# Patient Record
Sex: Male | Born: 1935 | Race: Black or African American | Hispanic: No | Marital: Married | Smoking: Current every day smoker
Health system: Southern US, Community
[De-identification: ages and names within clinical notes are randomized; demographics above are authoritative.]

## PROBLEM LIST (undated history)

## (undated) DIAGNOSIS — R430 Anosmia: Secondary | ICD-10-CM

## (undated) DIAGNOSIS — R1310 Dysphagia, unspecified: Secondary | ICD-10-CM

## (undated) DIAGNOSIS — M25569 Pain in unspecified knee: Secondary | ICD-10-CM

## (undated) DIAGNOSIS — M545 Low back pain, unspecified: Secondary | ICD-10-CM

## (undated) DIAGNOSIS — L729 Follicular cyst of the skin and subcutaneous tissue, unspecified: Secondary | ICD-10-CM

## (undated) DIAGNOSIS — R32 Unspecified urinary incontinence: Secondary | ICD-10-CM

## (undated) DIAGNOSIS — C3490 Malignant neoplasm of unspecified part of unspecified bronchus or lung: Secondary | ICD-10-CM

## (undated) DIAGNOSIS — G459 Transient cerebral ischemic attack, unspecified: Secondary | ICD-10-CM

## (undated) DIAGNOSIS — I10 Essential (primary) hypertension: Secondary | ICD-10-CM

## (undated) DIAGNOSIS — L84 Corns and callosities: Secondary | ICD-10-CM

---

## 2002-01-06 ENCOUNTER — Encounter: Admission: RE | Admit: 2002-01-06 | Discharge: 2002-01-06 | Payer: Self-pay | Admitting: Internal Medicine

## 2002-08-31 ENCOUNTER — Ambulatory Visit (HOSPITAL_COMMUNITY): Admission: RE | Admit: 2002-08-31 | Discharge: 2002-08-31 | Payer: Self-pay | Admitting: Internal Medicine

## 2002-08-31 ENCOUNTER — Encounter: Payer: Self-pay | Admitting: Internal Medicine

## 2002-08-31 ENCOUNTER — Encounter: Admission: RE | Admit: 2002-08-31 | Discharge: 2002-08-31 | Payer: Self-pay | Admitting: Internal Medicine

## 2005-09-04 ENCOUNTER — Encounter: Admission: RE | Admit: 2005-09-04 | Discharge: 2005-09-04 | Payer: Self-pay | Admitting: General Surgery

## 2005-09-07 ENCOUNTER — Ambulatory Visit (HOSPITAL_BASED_OUTPATIENT_CLINIC_OR_DEPARTMENT_OTHER): Admission: RE | Admit: 2005-09-07 | Discharge: 2005-09-07 | Payer: Self-pay | Admitting: General Surgery

## 2013-08-07 ENCOUNTER — Encounter (INDEPENDENT_AMBULATORY_CARE_PROVIDER_SITE_OTHER): Payer: Self-pay | Admitting: Ophthalmology

## 2013-09-17 ENCOUNTER — Encounter (HOSPITAL_COMMUNITY): Payer: Self-pay | Admitting: Emergency Medicine

## 2013-09-17 ENCOUNTER — Emergency Department (HOSPITAL_COMMUNITY): Payer: Medicare Other

## 2013-09-17 ENCOUNTER — Inpatient Hospital Stay (HOSPITAL_COMMUNITY)
Admission: EM | Admit: 2013-09-17 | Discharge: 2013-09-18 | DRG: 069 | Disposition: A | Discharge status: Home / Self Care | Payer: Medicare Other | Attending: Internal Medicine | Admitting: Internal Medicine

## 2013-09-17 DIAGNOSIS — G459 Transient cerebral ischemic attack, unspecified: Principal | ICD-10-CM | POA: Diagnosis present

## 2013-09-17 DIAGNOSIS — I10 Essential (primary) hypertension: Secondary | ICD-10-CM | POA: Diagnosis present

## 2013-09-17 DIAGNOSIS — Z79899 Other long term (current) drug therapy: Secondary | ICD-10-CM

## 2013-09-17 DIAGNOSIS — R4701 Aphasia: Secondary | ICD-10-CM | POA: Diagnosis present

## 2013-09-17 DIAGNOSIS — R42 Dizziness and giddiness: Secondary | ICD-10-CM | POA: Diagnosis present

## 2013-09-17 DIAGNOSIS — G8190 Hemiplegia, unspecified affecting unspecified side: Secondary | ICD-10-CM | POA: Diagnosis present

## 2013-09-17 DIAGNOSIS — R55 Syncope and collapse: Secondary | ICD-10-CM | POA: Diagnosis present

## 2013-09-17 HISTORY — DX: Essential (primary) hypertension: I10

## 2013-09-17 LAB — I-STAT TROPONIN, ED: Troponin i, poc: 0 ng/mL (ref 0.00–0.08)

## 2013-09-17 LAB — I-STAT CHEM 8, ED
BUN: 11 mg/dL (ref 6–23)
CREATININE: 1.1 mg/dL (ref 0.50–1.35)
Calcium, Ion: 1.09 mmol/L — ABNORMAL LOW (ref 1.13–1.30)
Chloride: 105 mEq/L (ref 96–112)
Glucose, Bld: 112 mg/dL — ABNORMAL HIGH (ref 70–99)
HCT: 44 % (ref 39.0–52.0)
HEMOGLOBIN: 15 g/dL (ref 13.0–17.0)
POTASSIUM: 4 meq/L (ref 3.7–5.3)
SODIUM: 140 meq/L (ref 137–147)
TCO2: 24 mmol/L (ref 0–100)

## 2013-09-17 LAB — COMPREHENSIVE METABOLIC PANEL
ALBUMIN: 4 g/dL (ref 3.5–5.2)
ALK PHOS: 60 U/L (ref 39–117)
ALT: 11 U/L (ref 0–53)
AST: 22 U/L (ref 0–37)
BILIRUBIN TOTAL: 0.7 mg/dL (ref 0.3–1.2)
BUN: 12 mg/dL (ref 6–23)
CO2: 24 mEq/L (ref 19–32)
Calcium: 9.3 mg/dL (ref 8.4–10.5)
Chloride: 100 mEq/L (ref 96–112)
Creatinine, Ser: 0.9 mg/dL (ref 0.50–1.35)
GFR calc Af Amer: >90 mL/min (ref >90)
GFR calc non Af Amer: 81 mL/min — ABNORMAL LOW (ref >90)
Glucose, Bld: 118 mg/dL — ABNORMAL HIGH (ref 70–99)
POTASSIUM: 4.2 meq/L (ref 3.7–5.3)
Sodium: 139 mEq/L (ref 137–147)
TOTAL PROTEIN: 7.8 g/dL (ref 6.0–8.3)

## 2013-09-17 LAB — DIFFERENTIAL
BASOS ABS: 0 10*3/uL (ref 0.0–0.1)
BASOS PCT: 1 % (ref 0–1)
Eosinophils Absolute: 0.1 10*3/uL (ref 0.0–0.7)
Eosinophils Relative: 1 % (ref 0–5)
Lymphocytes Relative: 45 % (ref 12–46)
Lymphs Abs: 1.9 10*3/uL (ref 0.7–4.0)
Monocytes Absolute: 0.4 10*3/uL (ref 0.1–1.0)
Monocytes Relative: 10 % (ref 3–12)
NEUTROS PCT: 43 % (ref 43–77)
Neutro Abs: 1.8 10*3/uL (ref 1.7–7.7)

## 2013-09-17 LAB — RAPID URINE DRUG SCREEN, HOSP PERFORMED
Amphetamines: NOT DETECTED
BARBITURATES: NOT DETECTED
BENZODIAZEPINES: NOT DETECTED
COCAINE: NOT DETECTED
OPIATES: NOT DETECTED
Tetrahydrocannabinol: NOT DETECTED

## 2013-09-17 LAB — CBC
HCT: 39.9 % (ref 39.0–52.0)
HEMOGLOBIN: 14.1 g/dL (ref 13.0–17.0)
MCH: 34.4 pg — ABNORMAL HIGH (ref 26.0–34.0)
MCHC: 35.3 g/dL (ref 30.0–36.0)
MCV: 97.3 fL (ref 78.0–100.0)
Platelets: 236 10*3/uL (ref 150–400)
RBC: 4.1 MIL/uL — ABNORMAL LOW (ref 4.22–5.81)
RDW: 17.5 % — AB (ref 11.5–15.5)
WBC: 4.2 10*3/uL (ref 4.0–10.5)

## 2013-09-17 LAB — URINALYSIS, ROUTINE W REFLEX MICROSCOPIC
BILIRUBIN URINE: NEGATIVE
Glucose, UA: NEGATIVE mg/dL
HGB URINE DIPSTICK: NEGATIVE
KETONES UR: NEGATIVE mg/dL
Leukocytes, UA: NEGATIVE
NITRITE: NEGATIVE
PH: 7.5 (ref 5.0–8.0)
Protein, ur: NEGATIVE mg/dL
Specific Gravity, Urine: 1.017 (ref 1.005–1.030)
Urobilinogen, UA: 1 mg/dL (ref 0.0–1.0)

## 2013-09-17 LAB — PROTIME-INR
INR: 0.92 (ref 0.00–1.49)
PROTHROMBIN TIME: 12.2 s (ref 11.6–15.2)

## 2013-09-17 LAB — ETHANOL: Alcohol, Ethyl (B): <11 mg/dL (ref 0–11)

## 2013-09-17 LAB — CBG MONITORING, ED: GLUCOSE-CAPILLARY: 90 mg/dL (ref 70–99)

## 2013-09-17 LAB — APTT: APTT: 30 s (ref 24–37)

## 2013-09-17 MED ORDER — METOPROLOL SUCCINATE ER 50 MG PO TB24
50.0000 mg | ORAL_TABLET | Freq: Every day | ORAL | Status: DC
  Administered 2013-09-18: 50 mg via ORAL
  Filled 2013-09-17: qty 1

## 2013-09-17 MED ORDER — ASPIRIN EC 81 MG PO TBEC
81.0000 mg | DELAYED_RELEASE_TABLET | Freq: Every day | ORAL | Status: DC
  Filled 2013-09-17: qty 1

## 2013-09-17 MED ORDER — LABETALOL HCL 5 MG/ML IV SOLN
20.0000 mg | Freq: Once | INTRAVENOUS | Status: AC
  Administered 2013-09-17: 20 mg via INTRAVENOUS
  Filled 2013-09-17: qty 4

## 2013-09-17 MED ORDER — ASPIRIN 325 MG PO TABS
325.0000 mg | ORAL_TABLET | Freq: Every day | ORAL | Status: DC
Start: 2013-09-17 — End: 2013-09-18
  Administered 2013-09-18 (×2): 325 mg via ORAL
  Filled 2013-09-17 (×3): qty 1

## 2013-09-17 MED ORDER — ENOXAPARIN SODIUM 40 MG/0.4ML ~~LOC~~ SOLN
40.0000 mg | SUBCUTANEOUS | Status: DC
  Administered 2013-09-17: 40 mg via SUBCUTANEOUS
  Filled 2013-09-17 (×2): qty 0.4

## 2013-09-17 NOTE — ED Provider Notes (Signed)
CSN: [id number]     Arrival date & time 09/17/13  1352 History   First MD Initiated Contact with Patient 09/17/13 1355     Chief Complaint  Patient presents with  . Code Stroke     HPI  Patient presents as a code stroke. Notably, on arrival to the emergency department patient's symptoms are resolved. Patient states that approximately one hour prior to ED arrival the patient had one episode of syncope, subsequently felt weakness throughout the left side, aphasia, no pain. There was some waxing/waning character to the symptoms, though they were present until just before my evaluation. Patient denies history of syncope or TIA or stroke.   No past medical history on file. No past surgical history on file. No family history on file. History  Substance Use Topics  . Smoking status: Not on file  . Smokeless tobacco: Not on file  . Alcohol Use: Not on file    Review of Systems  Constitutional:       Per HPI, otherwise negative  HENT:       Per HPI, otherwise negative  Respiratory:       Per HPI, otherwise negative  Cardiovascular:       Per HPI, otherwise negative  Gastrointestinal: Negative for vomiting.  Endocrine:       Negative aside from HPI  Genitourinary:       Neg aside from HPI   Musculoskeletal:       Per HPI, otherwise negative  Skin: Negative.   Neurological: Positive for syncope, facial asymmetry, weakness and light-headedness. Negative for seizures and headaches.      Allergies  Review of patient's allergies indicates not on file.  Home Medications  No current outpatient prescriptions on file. There were no vitals taken for this visit. Physical Exam  Nursing note and vitals reviewed. Constitutional: He is oriented to person, place, and time. He appears well-developed. No distress.  HENT:  Head: Normocephalic and atraumatic.  Eyes: Conjunctivae and EOM are normal.  Cardiovascular: Normal rate and regular rhythm.   Pulmonary/Chest: Effort normal. No  stridor. No respiratory distress.  Abdominal: He exhibits no distension.  Musculoskeletal: He exhibits no edema.  Neurological: He is alert and oriented to person, place, and time. No cranial nerve deficit. He exhibits normal muscle tone. Coordination normal.  No facial symmetry, grip strength is 5/5 in both hands, patient was ultimately spontaneously Patient is oriented x3, denies any weakness anywhere.  Skin: Skin is warm and dry.  Psychiatric: He has a normal mood and affect.    ED Course  Procedures (including critical care time) Labs Review Labs Reviewed  I-STAT CHEM 8, ED - Abnormal; Notable for the following:    Glucose, Bld 112 (*)    Calcium, Ion 1.09 (*)    All other components within normal limits  PROTIME-INR  APTT  CBC  DIFFERENTIAL  COMPREHENSIVE METABOLIC PANEL  ETHANOL  URINE RAPID DRUG SCREEN (HOSP PERFORMED)  URINALYSIS, ROUTINE W REFLEX MICROSCOPIC  CBG MONITORING, ED  I-STAT TROPOININ, ED  I-STAT TROPOININ, ED   Imaging Review Ct Head (brain) Wo Contrast  09/17/2013   CLINICAL DATA:  79 year old male with left side weakness and facial droop. Code stroke. Unable to speak. Initial encounter.  EXAM: CT HEAD WITHOUT CONTRAST  TECHNIQUE: Contiguous axial images were obtained from the base of the skull through the vertex without intravenous contrast.  COMPARISON:  None.  FINDINGS: No acute orbit or scalp soft tissue findings. Mild paranasal sinus mucosal thickening, with  bubbly opacity in the left sphenoid. Tympanic cavities and mastoids are clear. No acute osseous abnormality identified.  Calcified atherosclerosis at the skull base. Patchy bilateral cerebral white matter hypodensity. Some involvement of the deep white matter capsules. Suggestion of chronic right lentiform nuclei lacunar infarct. No suspicious intracranial vascular hyperdensity. No evidence of cortically based acute infarction identified.  No ventriculomegaly. No midline shift, mass effect, or evidence  of intracranial mass lesion. No acute intracranial hemorrhage identified.  IMPRESSION: Chronic small vessel ischemia. No acute cortically based infarct is evident. No acute intracranial abnormality identified.  Study discussed by telephone with Dr. Armida Sans On 09/17/2013 at 14:10 .   Electronically Signed   By: Lars Pinks M.D.   On: 09/17/2013 14:11     EKG Interpretation   Date/Time:  Sunday September 17 2013 14:09:29 EDT Ventricular Rate:  73 PR Interval:  148 QRS Duration: 80 QT Interval:  388 QTC Calculation: 427 R Axis:   67 Text Interpretation:  Sinus rhythm LAE, consider biatrial enlargement  Anterior infarct, old Sinus rhythm Artifact ST-t abnormality No  significant change since last tracing Abnormal ekg Confirmed by Carmin Muskrat  MD [phone]) on 09/17/2013 2:17:09 PM      MDM  Patient presents after syncopal episode with subsequent left-sided weakness, facial asymmetry, aphasia. Symptoms resolved entirely, though with no prior workup, and with risk factors, patient was admitted for TIA evaluation after evaluation with our neurology colleagues commanding, labs, stabilization.  Carmin Muskrat, MD 09/17/13 1505

## 2013-09-17 NOTE — Consult Note (Signed)
Referring Physician: Dr Vanita Panda    Chief Complaint: TIA  HPI: Andre Silva is a 79 y.o. male who has not had regular medical care over the years. About a month ago he saw Dr. Nolene Ebbs  for evaluation of hypertension and was placed on metoprolol. The dosage is not available to this time. Today, 09/17/2013, the patient was at home working on his computer when he began to feel lightheaded. He went to lie down on his couch and after a while felt better. He decided to go outside to play with his dogs. His neighbor was outside doing some yard work. The neighbor noticed that the patient appeared to be staggering. Shortly thereafter the patient collapsed and his neighbor came over to assist him. The patient was unable to talk and was unable to move his left side. His neighbor helped him into his house and placed him back on the couch. The patient called 911; however, by the time they arrived the patient's deficits had completely resolved. They were about to leave his house when suddenly the patient's left face began to droop and he once again he became plegic on the left with inability to speak. He was brought to [facility] as a code stroke although by the time they arrived in the ED he was again back to normal. A stat head CT was performed which showed no acute findings. His NIH scale was 0. The code stroke was canceled. He is to be admitted for a TIA workup. The patient states that he has had several similar episodes in the past prior to starting metoprolol. He has never had complete loss of consciousness with these episodes although he has experienced left-sided weakness. Systolic blood pressure was greater than 200 on arrival.  Date last known well: 09/17/2013 Time last known well: Unable to determine tPA Given: No: Deficits completely resolved NIHS - 0  Past Medical History  Diagnosis Date  . Hypertension   Probable COPD  Surgical history - prostate surgery  Family history - his  mother died at age 6. His father died at age 49. He does not know details regarding their health.  Social History:  The patient lives alone most of the time. He has a girlfriend who visits frequently. He quit smoking approximately one year ago but states he smoked most of his life.  Allergies - KNDA  Medications: The patient started metoprolol proximally 1 month ago for hypertension. The dosage is not available to this time. He was not on antiplatelet therapy.   No current facility-administered medications for this encounter.   No current outpatient prescriptions on file.    ROS: History obtained from the patient  General ROS: negative for - chills, fatigue, fever, night sweats. The patient appears thin however he states that he has had no change in his weight over the years. Psychological ROS: negative for - behavioral disorder, hallucinations, memory difficulties, mood swings or suicidal ideation Ophthalmic ROS: negative for - blurry vision, double vision, eye pain or loss of vision ENT ROS: negative for - epistaxis, nasal discharge, oral lesions, sore throat, tinnitus or vertigo Allergy and Immunology ROS: negative for - hives or itchy/watery eyes Hematological and Lymphatic ROS: negative for - bleeding problems, bruising or swollen lymph nodes Endocrine ROS: negative for - galactorrhea, hair pattern changes, polydipsia/polyuria or temperature intolerance Respiratory ROS: negative for - cough, hemoptysis, shortness of breath or wheezing Cardiovascular ROS: negative for - chest pain, dyspnea on exertion, edema or irregular heartbeat Gastrointestinal ROS:  negative for - abdominal pain, diarrhea, hematemesis, nausea/vomiting or stool incontinence. The patient has had difficulty swallowing for quite some time. He feels as if food gets stuck in his throat and at times has difficulty handling his own secretions. He has never had this evaluated. Genito-Urinary ROS: negative for - dysuria,  hematuria, incontinence or urinary frequency/urgency Musculoskeletal ROS: negative for - joint swelling or muscular weakness Neurological ROS: as noted in HPI Dermatological ROS: negative for rash and skin lesion changes   Physical Examination: There were no vitals taken for this visit.  General - Thin pleasant 79 year old male with gravelly sounding voice but in no acute distress. Heart - Regular rate and rhythm - distant heart sounds Lungs - decreased breath sounds throughout. No wheezing, rales, or rhonchi Extremities - no edema Skin - Warm and dry   Neurologic Examination: Mental Status: Alert, oriented, thought content appropriate.  Speech fluent without evidence of aphasia.  Able to follow 3 step commands without difficulty. Cranial Nerves: II: Discs flat bilaterally; Visual fields grossly normal, pupils equal, round, reactive to light and accommodation III,IV, VI: ptosis not present, extra-ocular motions intact bilaterally V,VII: smile at times appears asymmetric however with closer examination there is not appear to be weakness or drooping, facial light touch sensation normal bilaterally VIII: hearing normal bilaterally IX,X: gag reflex present XI: bilateral shoulder shrug XII: midline tongue extension Motor: Right : Upper extremity   5/5    Left:     Upper extremity   5/5  Lower extremity   5/5     Lower extremity   5/5 Tone and bulk:normal tone throughout; no atrophy noted Sensory: Pinprick and light touch intact throughout, bilaterally Deep Tendon Reflexes: 2+ and symmetric throughout Plantars: Right: downgoing   Left: downgoing Cerebellar: normal finger-to-nose Gait: Deferred   Laboratory Studies:  Basic Metabolic Panel:  Recent Labs Lab 09/17/13 1354 09/17/13 1404  NA 139 140  K 4.2 4.0  CL 100 105  CO2 24  --   GLUCOSE 118* 112*  BUN 12 11  CREATININE 0.90 1.10  CALCIUM 9.3  --     Liver Function Tests:  Recent Labs Lab 09/17/13 1354  AST 22   ALT 11  ALKPHOS 60  BILITOT 0.7  PROT 7.8  ALBUMIN 4.0   No results found for this basename: LIPASE, AMYLASE,  in the last 168 hours No results found for this basename: AMMONIA,  in the last 168 hours  CBC:  Recent Labs Lab 09/17/13 1354 09/17/13 1404  WBC 4.2  --   NEUTROABS 1.8  --   HGB 14.1 15.0  HCT 39.9 44.0  MCV 97.3  --   PLT 236  --     Cardiac Enzymes: No results found for this basename: CKTOTAL, CKMB, CKMBINDEX, TROPONINI,  in the last 168 hours  BNP: No components found with this basename: POCBNP,   CBG:  Recent Labs Lab 09/17/13 1411  GLUCAP 90    Microbiology: No results found for this or any previous visit.  Coagulation Studies:  Recent Labs  09/17/13 1354  LABPROT 12.2  INR 0.92    Urinalysis: No results found for this basename: COLORURINE, APPERANCEUR, LABSPEC, PHURINE, GLUCOSEU, HGBUR, BILIRUBINUR, KETONESUR, PROTEINUR, UROBILINOGEN, NITRITE, LEUKOCYTESUR,  in the last 168 hours  Lipid Panel: No results found for this basename: chol, trig, hdl, cholhdl, vldl, ldlcalc    HgbA1C:  No results found for this basename: HGBA1C    Urine Drug Screen:   No results found for this basename:  labopia, cocainscrnur, labbenz, amphetmu, thcu, labbarb    Alcohol Level:  Recent Labs Lab 09/17/13 1354  ETH <11    Other results: EKG: Sinus rhythm rate 73 beats per minute.  See formal reading for complete details.  Imaging:  Ct Head (brain) Wo Contrast 09/17/2013    Chronic small vessel ischemia. No acute cortically based infarct is evident. No acute intracranial abnormality identified.      Assessment: 79 y.o. male with lightheadedness, presyncope, collapse, transient aphasia, and transient recurrent left hemiplegia. Suspect TIAs. Similar episodes in the past. Admit for full workup.  Stroke Risk Factors - hypertension and tobacco - quite 1 year ago  Plan: 1. HgbA1c, fasting lipid panel 2. MRI, MRA  of the brain without contrast 3.  Speech consult 4. Echocardiogram 5. Carotid dopplers 6. Prophylactic therapy-Antiplatelet med: Aspirin - dose 81 mg daily 7. Risk factor modification 8. Telemetry monitoring 9. Frequent neuro checks   Lowry Ram [facility] Pager [phone] 09/17/2013, 3:27 PM   Patient seen and examined together with physician assistant and I concur with the assessment and plan.  Dorian Pod, MD

## 2013-09-17 NOTE — Progress Notes (Signed)
Patient admitted to the floor from the ER, PATIENT IS ALERT AND ORIENTED X4

## 2013-09-17 NOTE — H&P (Signed)
[facility] History and Physical  Andre Silva FXT:[id number] DOB: 1935-04-14 DOA: 09/17/2013  Referring physician: ER physician PCP: No primary provider on file.   Chief Complaint: syncope  HPI:  79 year old male with no significant past medical history other than recently diagnosed hypertension who presented to [facility] ED 09/17/2013 after syncopal event at home. Patient reported prodromal symptoms of lightheadedness prior to syncope. Patient did not other symptoms of chest pain, shortness of breath or palpitations. He was assisted by his neighbor who saw him collapsing. There was no reported seizures. No fever. No blood in stool or urine. No nausea or vomiting. By the time EMS arrived pt symptoms all resolved.  In ED, vitals are stable. All symptoms are now resolved. CT head did not show acute intracranial findings. Neurology was consulted by ED physician.  Assessment and Plan:  Active Problems:   Syncope, TIA (transient ischemic attack) - unclear etiology - no acute intracranial findings on CT head - appreciate neurology assistance; will check MRI brain, carotid doppler, lipid panel, A1c - PT/OT evaluation   Hypertension - on metoprolol 50 mg daily   Radiological Exams on Admission: Ct Head (brain) Wo Contrast 09/17/2013   IMPRESSION: Chronic small vessel ischemia. No acute cortically based infarct is evident. No acute intracranial abnormality identified.  Code Status: Full Family Communication: Pt at bedside Disposition Plan: Admit for further evaluation  Leisa Lenz, MD  [facility] Pager [phone]  Review of Systems:  Constitutional: Negative for fever, chills and malaise/fatigue. Negative for diaphoresis.  HENT: Negative for hearing loss, ear pain, nosebleeds, congestion, sore throat, neck pain, tinnitus and ear discharge.  Eyes: Negative for blurred vision, double vision, photophobia, pain, discharge and redness.  Respiratory: Negative for cough, hemoptysis,  sputum production, shortness of breath, wheezing and stridor.   Cardiovascular: Negative for chest pain, palpitations, orthopnea, claudication and leg swelling.  Gastrointestinal: Negative for nausea, vomiting and abdominal pain. Negative for heartburn, constipation, blood in stool and melena.  Genitourinary: Negative for dysuria, urgency, frequency, hematuria and flank pain.  Musculoskeletal: Negative for myalgias, back pain, joint pain and falls.  Skin: Negative for itching and rash.  Neurological: per HPI  Endo/Heme/Allergies: Negative for environmental allergies and polydipsia. Does not bruise/bleed easily.  Psychiatric/Behavioral: Negative for suicidal ideas. The patient is not nervous/anxious.      Past Medical History  Diagnosis Date  . Hypertension    History reviewed. No pertinent past surgical history. Social History:  reports that he has quit smoking. He does not have any smokeless tobacco history on file. His alcohol and drug histories are not on file.  No Known Allergies  Family History: no history of cancers, no cardiovascular diseases on mother or father side  Prior to Admission medications   Medication Sig Start Date End Date Taking? Authorizing Provider  chlorhexidine (PERIDEX) 0.12 % solution Use as directed 10 mLs in the mouth or throat 2 (two) times daily.  08/23/13  Yes Historical Provider, MD  metoprolol succinate (TOPROL-XL) 50 MG 24 hr tablet Take 50 mg by mouth daily. 08/23/13  Yes Historical Provider, MD  Omega-3 Fatty Acids (FISH OIL PO) Take 1 capsule by mouth every other day.   Yes Historical Provider, MD  OVER THE COUNTER MEDICATION Place 1 drop into both eyes daily as needed (dry eyes).   Yes Historical Provider, MD   Physical Exam: There were no vitals filed for this visit.  Physical Exam  Constitutional: Appears well-developed and well-nourished. No distress.  HENT: Normocephalic. External  right and left ear normal. Oropharynx is clear and moist.  Eyes:  Conjunctivae and EOM are normal. PERRLA, no scleral icterus.  Neck: Normal ROM. Neck supple. No JVD. No tracheal deviation. No thyromegaly.  CVS: RRR, S1/S2 +, no murmurs, no gallops, no carotid bruit.  Pulmonary: Effort and breath sounds normal, no stridor, rhonchi, wheezes, rales.  Abdominal: Soft. BS +,  no distension, tenderness, rebound or guarding.  Musculoskeletal: Normal range of motion. No edema and no tenderness.  Lymphadenopathy: No lymphadenopathy noted, cervical, inguinal. Neuro: Alert. Normal reflexes, muscle tone coordination. No cranial nerve deficit. Skin: Skin is warm and dry. No rash noted. Not diaphoretic. No erythema. No pallor.  Psychiatric: Normal mood and affect. Behavior, judgment, thought content normal.   Labs on Admission:  Basic Metabolic Panel:  Recent Labs Lab 09/17/13 1354 09/17/13 1404  NA 139 140  K 4.2 4.0  CL 100 105  CO2 24  --   GLUCOSE 118* 112*  BUN 12 11  CREATININE 0.90 1.10  CALCIUM 9.3  --    Liver Function Tests:  Recent Labs Lab 09/17/13 1354  AST 22  ALT 11  ALKPHOS 60  BILITOT 0.7  PROT 7.8  ALBUMIN 4.0   No results found for this basename: LIPASE, AMYLASE,  in the last 168 hours No results found for this basename: AMMONIA,  in the last 168 hours CBC:  Recent Labs Lab 09/17/13 1354 09/17/13 1404  WBC 4.2  --   NEUTROABS 1.8  --   HGB 14.1 15.0  HCT 39.9 44.0  MCV 97.3  --   PLT 236  --    Cardiac Enzymes: No results found for this basename: CKTOTAL, CKMB, CKMBINDEX, TROPONINI,  in the last 168 hours BNP: No components found with this basename: POCBNP,  CBG:  Recent Labs Lab 09/17/13 1411  GLUCAP 90    If 7PM-7AM, please contact night-coverage [email or web address] Password [email or web address] 09/17/2013, 3:23 PM

## 2013-09-17 NOTE — ED Notes (Signed)
PT reported to EMS that he had a weak episode out his yard today.A neighbor helped PT into his home. EMS staff reported PT was neg for stroke SX's on there arrival. During assessment PT developed a LT sided  mouth droop and was unable to move LT side. On arrival to [facility] Pt had no Symptoms . Smile equal . Pt had full movement of LT side.

## 2013-09-17 NOTE — ED Notes (Signed)
NEXT neuro check 1900

## 2013-09-18 ENCOUNTER — Inpatient Hospital Stay (HOSPITAL_COMMUNITY): Payer: Medicare Other

## 2013-09-18 DIAGNOSIS — I10 Essential (primary) hypertension: Secondary | ICD-10-CM | POA: Diagnosis present

## 2013-09-18 DIAGNOSIS — R55 Syncope and collapse: Secondary | ICD-10-CM | POA: Diagnosis present

## 2013-09-18 DIAGNOSIS — I519 Heart disease, unspecified: Secondary | ICD-10-CM

## 2013-09-18 LAB — CBC
HCT: 35 % — ABNORMAL LOW (ref 39.0–52.0)
Hemoglobin: 12.2 g/dL — ABNORMAL LOW (ref 13.0–17.0)
MCH: 34.3 pg — AB (ref 26.0–34.0)
MCHC: 34.9 g/dL (ref 30.0–36.0)
MCV: 98.3 fL (ref 78.0–100.0)
Platelets: 207 10*3/uL (ref 150–400)
RBC: 3.56 MIL/uL — ABNORMAL LOW (ref 4.22–5.81)
RDW: 17.5 % — AB (ref 11.5–15.5)
WBC: 2.9 10*3/uL — AB (ref 4.0–10.5)

## 2013-09-18 LAB — RAPID URINE DRUG SCREEN, HOSP PERFORMED
AMPHETAMINES: NOT DETECTED
Barbiturates: NOT DETECTED
Benzodiazepines: NOT DETECTED
Cocaine: NOT DETECTED
OPIATES: NOT DETECTED
Tetrahydrocannabinol: NOT DETECTED

## 2013-09-18 LAB — GLUCOSE, CAPILLARY
GLUCOSE-CAPILLARY: 131 mg/dL — AB (ref 70–99)
Glucose-Capillary: 82 mg/dL (ref 70–99)

## 2013-09-18 LAB — LIPID PANEL
CHOLESTEROL: 149 mg/dL (ref 0–200)
HDL: 57 mg/dL (ref >39)
LDL Cholesterol: 82 mg/dL (ref 0–99)
Total CHOL/HDL Ratio: 2.6 RATIO
Triglycerides: 51 mg/dL (ref <150)
VLDL: 10 mg/dL (ref 0–40)

## 2013-09-18 LAB — BASIC METABOLIC PANEL
BUN: 16 mg/dL (ref 6–23)
CALCIUM: 8.8 mg/dL (ref 8.4–10.5)
CO2: 24 mEq/L (ref 19–32)
CREATININE: 0.91 mg/dL (ref 0.50–1.35)
Chloride: 103 mEq/L (ref 96–112)
GFR calc non Af Amer: 80 mL/min — ABNORMAL LOW (ref >90)
Glucose, Bld: 91 mg/dL (ref 70–99)
Potassium: 4.3 mEq/L (ref 3.7–5.3)
Sodium: 141 mEq/L (ref 137–147)

## 2013-09-18 LAB — HEMOGLOBIN A1C
Hgb A1c MFr Bld: 5.6 % (ref <5.7)
MEAN PLASMA GLUCOSE: 114 mg/dL (ref <117)

## 2013-09-18 MED ORDER — AMLODIPINE BESYLATE 5 MG PO TABS: 5.0000 mg | ORAL_TABLET | Freq: Every day | ORAL | Status: DC

## 2013-09-18 MED ORDER — HYDRALAZINE HCL 20 MG/ML IJ SOLN
10.0000 mg | Freq: Once | INTRAMUSCULAR | Status: AC
  Administered 2013-09-18: 10 mg via INTRAVENOUS
  Filled 2013-09-18: qty 1

## 2013-09-18 MED ORDER — ASPIRIN 325 MG PO TABS: 325.0000 mg | ORAL_TABLET | Freq: Every day | ORAL | Status: DC

## 2013-09-18 NOTE — Progress Notes (Signed)
NEURO HOSPITALIST PROGRESS NOTE   SUBJECTIVE:                                                                                                                        No further complaints or issues. Symptoms have resolved.   OBJECTIVE:                                                                                                                           Vital signs in last 24 hours: Temp:  [98 F (36.7 C)-98.3 F (36.8 C)] 98 F (36.7 C) (03/30 0518) Pulse Rate:  [64-119] 72 (03/30 0518) Resp:  [12-21] 16 (03/30 0518) BP: (122-208)/(74-122) 134/81 mmHg (03/30 0518) SpO2:  [91 %-100 %] 97 % (03/30 0518)  Intake/Output from previous day: 03/29 0701 - 03/30 0700 In: -  Out: 200 [Urine:200] Intake/Output this shift:   Nutritional status: Cardiac  Past Medical History  Diagnosis Date  . Hypertension      Neurologic Exam:  Mental Status: Alert, oriented, thought content appropriate.  Speech fluent without evidence of aphasia.  Able to follow 3 step commands without difficulty. Cranial Nerves: II: Discs flat bilaterally; Visual fields grossly normal, pupils equal, round, reactive to light and accommodation III,IV, VI: ptosis not present, extra-ocular motions intact bilaterally V,VII: smile symmetric, facial light touch sensation normal bilaterally VIII: hearing normal bilaterally IX,X: gag reflex present XI: bilateral shoulder shrug XII: midline tongue extension without atrophy or fasciculations  Motor: Right : Upper extremity   5/5    Left:     Upper extremity   5/5  Lower extremity   5/5     Lower extremity   5/5 Tone and bulk:normal tone throughout; no atrophy noted Sensory: Pinprick and light touch intact throughout, bilaterally Deep Tendon Reflexes:  Right: Upper Extremity   Left: Upper extremity   biceps (C-5 to C-6) 2/4   biceps (C-5 to C-6) 2/4 tricep (C7) 2/4    triceps (C7) 2/4 Brachioradialis (C6) 2/4  Brachioradialis  (C6) 2/4  Lower Extremity Lower Extremity  quadriceps (L-2 to L-4) 2/4   quadriceps (L-2 to L-4) 2/4 Achilles (S1) 2/4   Achilles (S1) 2/4  Plantars: Right: downgoing   Left: downgoing    Lab  Results: Basic Metabolic Panel:  Recent Labs Lab 09/17/13 1354 09/17/13 1404 09/18/13 0415  NA 139 140 141  K 4.2 4.0 4.3  CL 100 105 103  CO2 24  --  24  GLUCOSE 118* 112* 91  BUN 12 11 16   CREATININE 0.90 1.10 0.91  CALCIUM 9.3  --  8.8    Liver Function Tests:  Recent Labs Lab 09/17/13 1354  AST 22  ALT 11  ALKPHOS 60  BILITOT 0.7  PROT 7.8  ALBUMIN 4.0   No results found for this basename: LIPASE, AMYLASE,  in the last 168 hours No results found for this basename: AMMONIA,  in the last 168 hours  CBC:  Recent Labs Lab 09/17/13 1354 09/17/13 1404 09/18/13 0415  WBC 4.2  --  2.9*  NEUTROABS 1.8  --   --   HGB 14.1 15.0 12.2*  HCT 39.9 44.0 35.0*  MCV 97.3  --  98.3  PLT 236  --  207    Cardiac Enzymes: No results found for this basename: CKTOTAL, CKMB, CKMBINDEX, TROPONINI,  in the last 168 hours  Lipid Panel:  Recent Labs Lab 09/18/13 0415  CHOL 149  TRIG 51  HDL 57  CHOLHDL 2.6  VLDL 10  LDLCALC 82    CBG:  Recent Labs Lab 09/17/13 1411 09/17/13 2223 09/18/13 0644  GLUCAP 90 131* 82    Microbiology: No results found for this or any previous visit.  Coagulation Studies:  Recent Labs  09/17/13 1354  LABPROT 12.2  INR 0.92    Imaging: Ct Head (brain) Wo Contrast  09/17/2013   CLINICAL DATA:  79 year old male with left side weakness and facial droop. Code stroke. Unable to speak. Initial encounter.  EXAM: CT HEAD WITHOUT CONTRAST  TECHNIQUE: Contiguous axial images were obtained from the base of the skull through the vertex without intravenous contrast.  COMPARISON:  None.  FINDINGS: No acute orbit or scalp soft tissue findings. Mild paranasal sinus mucosal thickening, with bubbly opacity in the left sphenoid. Tympanic cavities  and mastoids are clear. No acute osseous abnormality identified.  Calcified atherosclerosis at the skull base. Patchy bilateral cerebral white matter hypodensity. Some involvement of the deep white matter capsules. Suggestion of chronic right lentiform nuclei lacunar infarct. No suspicious intracranial vascular hyperdensity. No evidence of cortically based acute infarction identified.  No ventriculomegaly. No midline shift, mass effect, or evidence of intracranial mass lesion. No acute intracranial hemorrhage identified.  IMPRESSION: Chronic small vessel ischemia. No acute cortically based infarct is evident. No acute intracranial abnormality identified.  Study discussed by telephone with Dr. Armida Sans On 09/17/2013 at 14:10 .   Electronically Signed   By: Lars Pinks M.D.   On: 09/17/2013 14:11   Mri Brain Without Contrast  09/18/2013   CLINICAL DATA:  Stroke.  Hypertension.  EXAM: MRI HEAD WITHOUT CONTRAST  MRA HEAD WITHOUT CONTRAST  TECHNIQUE: Multiplanar, multiecho pulse sequences of the brain and surrounding structures were obtained without intravenous contrast. Angiographic images of the head were obtained using MRA technique without contrast.  COMPARISON:  CT head 09/17/2013  FINDINGS: MRI HEAD FINDINGS  Negative for acute infarct.  Generalized atrophy with chronic microvascular ischemic change throughout the white matter and pons.  Negative for intracranial hemorrhage. Negative for mass or edema. No shift of the midline structures.  Mild mucosal edema in the right maxillary sinus.  MRA HEAD FINDINGS  Both vertebral arteries are patent to the basilar. Left vertebral artery is dominant. PICA is patent bilaterally.  AICA patent bilaterally. Superior cerebellar and posterior cerebral arteries widely patent bilaterally.  Internal carotid artery is widely patent bilaterally. Moderate focal stenosis in the left A1 segment. No significant middle cerebral artery stenosis. Anterior and middle cerebral arteries are patent  bilaterally.  Negative for cerebral aneurysm.  IMPRESSION: Atrophy and moderate chronic microvascular ischemic change. No acute infarct.  Moderate focal stenosis left A1 segment.  No large vessel occlusion.   Electronically Signed   By: Franchot Gallo M.D.   On: 09/18/2013 11:35   Mr Jodene Nam Head/brain Wo Cm  09/18/2013   CLINICAL DATA:  Stroke.  Hypertension.  EXAM: MRI HEAD WITHOUT CONTRAST  MRA HEAD WITHOUT CONTRAST  TECHNIQUE: Multiplanar, multiecho pulse sequences of the brain and surrounding structures were obtained without intravenous contrast. Angiographic images of the head were obtained using MRA technique without contrast.  COMPARISON:  CT head 09/17/2013  FINDINGS: MRI HEAD FINDINGS  Negative for acute infarct.  Generalized atrophy with chronic microvascular ischemic change throughout the white matter and pons.  Negative for intracranial hemorrhage. Negative for mass or edema. No shift of the midline structures.  Mild mucosal edema in the right maxillary sinus.  MRA HEAD FINDINGS  Both vertebral arteries are patent to the basilar. Left vertebral artery is dominant. PICA is patent bilaterally. AICA patent bilaterally. Superior cerebellar and posterior cerebral arteries widely patent bilaterally.  Internal carotid artery is widely patent bilaterally. Moderate focal stenosis in the left A1 segment. No significant middle cerebral artery stenosis. Anterior and middle cerebral arteries are patent bilaterally.  Negative for cerebral aneurysm.  IMPRESSION: Atrophy and moderate chronic microvascular ischemic change. No acute infarct.  Moderate focal stenosis left A1 segment.  No large vessel occlusion.   Electronically Signed   By: Franchot Gallo M.D.   On: 09/18/2013 11:35       MEDICATIONS                                                                                                                        Scheduled: . aspirin  325 mg Oral Daily  . enoxaparin (LOVENOX) injection  40 mg Subcutaneous  Q24H  . metoprolol succinate  50 mg Oral Daily    ASSESSMENT/PLAN:                                                                                                            79 y.o. male with lightheadedness, presyncope, collapse, transient aphasia, and transient recurrent left hemiplegia. MRI brain is negative, A1c 5.6, LDL 82.  Echo and carotid  doppler pending. Likely TIA.  Awaiting Echo and carotid doppler. IF Echo and carotid normal no further neurology work up recommended. Will continue to follow.     Assessment and plan discussed with with attending physician and they are in agreement.    Etta Quill PA-C [facility] [phone]  09/18/2013, 12:27 PM

## 2013-09-18 NOTE — Progress Notes (Signed)
Echocardiogram 2D Echocardiogram has been performed.  Andre Silva 09/18/2013, 2:04 PM

## 2013-09-18 NOTE — Progress Notes (Signed)
   CARE MANAGEMENT NOTE 09/18/2013  Patient:  Andre Silva, Andre Silva   Account Number:  [id number]  Date Initiated:  09/18/2013  Documentation initiated by:  Olga Coaster  Subjective/Objective Assessment:   ADMITTED WITH SYNCOPY/ TIA WORKUP     Action/Plan:   CM FOLLOWING FOR DCP   Anticipated DC Date:  09/21/2013   Anticipated DC Plan:  [place]  CM consult          Status of service:  In process, will continue to follow Medicare Important Message given?  NA - LOS <3 / Initial given by admissions (If response is "NO", the following Medicare IM given date fields will be blank)  Per UR Regulation:  Reviewed for med. necessity/level of care/duration of stay  Comments:  3/30/2015Mindi Slicker RN,BSN,MHA [phone]

## 2013-09-18 NOTE — Progress Notes (Signed)
Patient ready for discharge, follow up BP checked is 125/66. Assessments remain unchanged as at now. Educated on the importance to follow up after d/c, on medication and the need to call 911 when symptoms are seen. Handout  For TIA was printed for him to read at home.Patient was wheeled down by the NT accompanied by a family member.

## 2013-09-18 NOTE — Progress Notes (Signed)
VASCULAR LAB PRELIMINARY  PRELIMINARY  PRELIMINARY  PRELIMINARY  Carotid duplex  completed.    Preliminary report:  Bilateral:  1-39% ICA stenosis.  Vertebral artery flow is antegrade.    Moderate soft plaque vs thrombus in the proximal left ICA.  Sammie Schermerhorn, RVT 09/18/2013, 2:11 PM

## 2013-09-18 NOTE — Discharge Summary (Signed)
Physician Discharge Summary  Andre Silva XIP:[id number] DOB: [birth year] DOA: 09/17/2013  PCP: No primary provider on file.  Admit date: 09/17/2013 Discharge date: 09/18/2013  Time spent: 35 minutes  Recommendations for Outpatient Follow-up:  1. Please follow up on patient's blood pressures, he was discharged on Metoprolol and Norvasc 2. Carotid Dopplers showing moderate soft plaque versus thrombus in the proximal left ICA. I discussed case with Dr. Armida Sans of neurology he recommended patient will followup at the neurology clinic for further evaluation and treatment  Discharge Diagnoses:  Principal Problem:   TIA (transient ischemic attack) Active Problems:   HTN (hypertension)   Syncope and collapse   Discharge Condition: Stable/improved  Diet recommendation: Heart healthy  There were no vitals filed for this visit.  History of present illness:  79 year old male with no significant past medical history other than recently diagnosed hypertension who presented to [name] ED 09/17/2013 after syncopal event at home. Patient reported prodromal symptoms of lightheadedness prior to syncope. Patient did not other symptoms of chest pain, shortness of breath or palpitations. He was assisted by his neighbor who saw him collapsing. There was no reported seizures. No fever. No blood in stool or urine. No nausea or vomiting. By the time EMS arrived pt symptoms all resolved.  In ED, vitals are stable. All symptoms are now resolved. CT head did not show acute intracranial findings. Neurology was consulted by ED physician.  Hospital Course:  Patient is a pleasant 79 year old gentleman with a history of hypertension, admitted to the medicine service on 09/17/2013 presenting with complaints of lightheadedness, syncope, left-sided facial droop, and aphasia. He reported that neurologic symptoms lasted for less than 10 minutes. By the time he reached the emergency department neurologic deficits have completely  resolved. TIA was suspected, patient was seen and evaluated by neurology. He was admitted to the neurology floor, undergoing TIA workup. MRI of brain performed on 09/18/2013 showing moderate chronic microvascular ischemic change with no acute infarct. Transthoracic echocardiogram showed an ejection fraction 65-70% without wall motion abnormalities. Carotid Doppler revealed moderate soft plaque versus thrombus in the proximal left ICA. I discussed his case with Dr. Armida Sans of neurology who did not recommend further neurologic workup during this hospitalization. He recommended patient followup with neurology in the outpatient setting. Patient did not have any further focal deficits or syncopal events. He remained stable, felt well enough to be discharged on 09/18/2013. He was discharged on aspirin 325 mg by mouth daily. Patient was also started on Norvasc in addition to his metoprolol given elevated blood pressures. Please followup on blood pressures on hospital followup visit.    Consultations:  Neurology  Discharge Exam: Filed Vitals:   09/18/13 1411  BP: 174/86  Pulse: 62  Temp: 98.4 F (36.9 C)  Resp: 18    General: Patient is in no acute distress awake alert oriented, states feeling well asking to go home. Cardiovascular: Regular rate rhythm normal S1-S2 Respiratory: Clear to auscultation bilaterally Abdomen: Soft nontender nondistended  Discharge Instructions      Discharge Orders   Future Orders Complete By Expires   Call MD for:  difficulty breathing, headache or visual disturbances  As directed    Call MD for:  extreme fatigue  As directed    Call MD for:  persistant dizziness or light-headedness  As directed    Call MD for:  persistant nausea and vomiting  As directed    Call MD for:  severe uncontrolled pain  As directed  Diet - low sodium heart healthy  As directed    Increase activity slowly  As directed        Medication List    STOP taking these medications        OVER THE COUNTER MEDICATION      TAKE these medications       amLODipine 5 MG tablet  Commonly known as:  NORVASC  Take 1 tablet (5 mg total) by mouth daily.     aspirin 325 MG tablet  Take 1 tablet (325 mg total) by mouth daily.     chlorhexidine 0.12 % solution  Commonly known as:  PERIDEX  Use as directed 10 mLs in the mouth or throat 2 (two) times daily.     FISH OIL PO  Take 1 capsule by mouth every other day.     metoprolol succinate 50 MG 24 hr tablet  Commonly known as:  TOPROL-XL  Take 50 mg by mouth daily.       No Known Allergies Follow-up Information   Follow up with PATEL, DONIKA, MD In 2 weeks.   Specialty:  Neurology   Contact information:   [address] [address] [phone]        The results of significant diagnostics from this hospitalization (including imaging, microbiology, ancillary and laboratory) are listed below for reference.    Significant Diagnostic Studies: Ct Head (brain) Wo Contrast  09/17/2013   CLINICAL DATA:  79 year old male with left side weakness and facial droop. Code stroke. Unable to speak. Initial encounter.  EXAM: CT HEAD WITHOUT CONTRAST  TECHNIQUE: Contiguous axial images were obtained from the base of the skull through the vertex without intravenous contrast.  COMPARISON:  None.  FINDINGS: No acute orbit or scalp soft tissue findings. Mild paranasal sinus mucosal thickening, with bubbly opacity in the left sphenoid. Tympanic cavities and mastoids are clear. No acute osseous abnormality identified.  Calcified atherosclerosis at the skull base. Patchy bilateral cerebral white matter hypodensity. Some involvement of the deep white matter capsules. Suggestion of chronic right lentiform nuclei lacunar infarct. No suspicious intracranial vascular hyperdensity. No evidence of cortically based acute infarction identified.  No ventriculomegaly. No midline shift, mass effect, or evidence of intracranial mass lesion. No acute  intracranial hemorrhage identified.  IMPRESSION: Chronic small vessel ischemia. No acute cortically based infarct is evident. No acute intracranial abnormality identified.  Study discussed by telephone with Dr. Armida Sans On 09/17/2013 at 14:10 .   Electronically Signed   By: Lars Pinks M.D.   On: 09/17/2013 14:11   Mri Brain Without Contrast  09/18/2013   CLINICAL DATA:  Stroke.  Hypertension.  EXAM: MRI HEAD WITHOUT CONTRAST  MRA HEAD WITHOUT CONTRAST  TECHNIQUE: Multiplanar, multiecho pulse sequences of the brain and surrounding structures were obtained without intravenous contrast. Angiographic images of the head were obtained using MRA technique without contrast.  COMPARISON:  CT head 09/17/2013  FINDINGS: MRI HEAD FINDINGS  Negative for acute infarct.  Generalized atrophy with chronic microvascular ischemic change throughout the white matter and pons.  Negative for intracranial hemorrhage. Negative for mass or edema. No shift of the midline structures.  Mild mucosal edema in the right maxillary sinus.  MRA HEAD FINDINGS  Both vertebral arteries are patent to the basilar. Left vertebral artery is dominant. PICA is patent bilaterally. AICA patent bilaterally. Superior cerebellar and posterior cerebral arteries widely patent bilaterally.  Internal carotid artery is widely patent bilaterally. Moderate focal stenosis in the left A1 segment. No  significant middle cerebral artery stenosis. Anterior and middle cerebral arteries are patent bilaterally.  Negative for cerebral aneurysm.  IMPRESSION: Atrophy and moderate chronic microvascular ischemic change. No acute infarct.  Moderate focal stenosis left A1 segment.  No large vessel occlusion.   Electronically Signed   By: Franchot Gallo M.D.   On: 09/18/2013 11:35   Mr Jodene Nam Head/brain Wo Cm  09/18/2013   CLINICAL DATA:  Stroke.  Hypertension.  EXAM: MRI HEAD WITHOUT CONTRAST  MRA HEAD WITHOUT CONTRAST  TECHNIQUE: Multiplanar, multiecho pulse sequences of the brain and  surrounding structures were obtained without intravenous contrast. Angiographic images of the head were obtained using MRA technique without contrast.  COMPARISON:  CT head 09/17/2013  FINDINGS: MRI HEAD FINDINGS  Negative for acute infarct.  Generalized atrophy with chronic microvascular ischemic change throughout the white matter and pons.  Negative for intracranial hemorrhage. Negative for mass or edema. No shift of the midline structures.  Mild mucosal edema in the right maxillary sinus.  MRA HEAD FINDINGS  Both vertebral arteries are patent to the basilar. Left vertebral artery is dominant. PICA is patent bilaterally. AICA patent bilaterally. Superior cerebellar and posterior cerebral arteries widely patent bilaterally.  Internal carotid artery is widely patent bilaterally. Moderate focal stenosis in the left A1 segment. No significant middle cerebral artery stenosis. Anterior and middle cerebral arteries are patent bilaterally.  Negative for cerebral aneurysm.  IMPRESSION: Atrophy and moderate chronic microvascular ischemic change. No acute infarct.  Moderate focal stenosis left A1 segment.  No large vessel occlusion.   Electronically Signed   By: Franchot Gallo M.D.   On: 09/18/2013 11:35    Microbiology: No results found for this or any previous visit (from the past 240 hour(s)).   Labs: Basic Metabolic Panel:  Recent Labs Lab 09/17/13 1354 09/17/13 1404 09/18/13 0415  NA 139 140 141  K 4.2 4.0 4.3  CL 100 105 103  CO2 24  --  24  GLUCOSE 118* 112* 91  BUN 12 11 16   CREATININE 0.90 1.10 0.91  CALCIUM 9.3  --  8.8   Liver Function Tests:  Recent Labs Lab 09/17/13 1354  AST 22  ALT 11  ALKPHOS 60  BILITOT 0.7  PROT 7.8  ALBUMIN 4.0   No results found for this basename: LIPASE, AMYLASE,  in the last 168 hours No results found for this basename: AMMONIA,  in the last 168 hours CBC:  Recent Labs Lab 09/17/13 1354 09/17/13 1404 09/18/13 0415  WBC 4.2  --  2.9*   NEUTROABS 1.8  --   --   HGB 14.1 15.0 12.2*  HCT 39.9 44.0 35.0*  MCV 97.3  --  98.3  PLT 236  --  207   Cardiac Enzymes: No results found for this basename: CKTOTAL, CKMB, CKMBINDEX, TROPONINI,  in the last 168 hours BNP: BNP (last 3 results) No results found for this basename: PROBNP,  in the last 8760 hours CBG:  Recent Labs Lab 09/17/13 1411 09/17/13 2223 09/18/13 0644  GLUCAP 90 131* 82       Signed:  Sabine Tenenbaum  [facility] 09/18/2013, 4:52 PM

## 2013-10-11 ENCOUNTER — Encounter: Payer: Self-pay | Admitting: Neurology

## 2013-10-11 ENCOUNTER — Ambulatory Visit (INDEPENDENT_AMBULATORY_CARE_PROVIDER_SITE_OTHER): Payer: Medicare Other | Admitting: Neurology

## 2013-10-11 VITALS — BP 116/70 | HR 84 | Temp 97.9°F | Ht 67.0 in | Wt 113.1 lb

## 2013-10-11 DIAGNOSIS — I6529 Occlusion and stenosis of unspecified carotid artery: Secondary | ICD-10-CM

## 2013-10-11 DIAGNOSIS — G459 Transient cerebral ischemic attack, unspecified: Secondary | ICD-10-CM

## 2013-10-11 MED ORDER — SIMVASTATIN 20 MG PO TABS: 20.0000 mg | ORAL_TABLET | Freq: Every day | ORAL | Status: DC

## 2013-10-11 NOTE — Patient Instructions (Addendum)
1. Start simvastatin 20mg  once a day for carotid artery disease 2. Schedule routine EEG 3. If symptoms recur, go to ER immediately 4. Follow-up in 3 months

## 2013-10-11 NOTE — Progress Notes (Signed)
NEUROLOGY CONSULTATION NOTE  Andre Silva MRN: [id number] DOB: 1934/07/19  Referring provider: Dr. Kelvin Cellar Primary care provider: Dr. Nolene Ebbs  Reason for consult:  Recent TIA admission  Dear Dr Coralyn Pear:  Thank you for your kind referral of Andre Silva for consultation of the above symptoms. Although his history is well known to you, please allow me to reiterate it for the purpose of our medical record. The patient was accompanied to the clinic by his girlfriend who also provides collateral information. Records and images were personally reviewed where available.  HISTORY OF PRESENT ILLNESS: This is a pleasant 79 year old right-handed man with no regular medical care for several years, recently established care with his PCP and started on anti-hypertensive medications.  On 09/17/2013, he was at home working on his computer when he began to feel lightheaded. He lay down and when he felt better, he went out to play with his dogs.  He started feeling woozy and wobbly, and a neighbor came over to help him as he began to fall with left arm and leg weakness.  There was note of inability to talk and left facial weakness with slurred speech. He denied any jerking, numbness/tingling, headaches.  According to the patient, the episode lasted 5 minutes, and by the time EMS arrived, deficits completely resolved.  There is note on records that as they were about to leave his house, his left face began to droop and he again became plegic on the left with inability to speak.  He was back to normal on arrival to the ER, NIHSS 0, head CT unremarkable.  Systolic pressure was greater than 200 on arrival.  He was admitted for a TIA workup, I personally reviewed MRI brain and MRA head.  There was no evidence of acute infarct.  There was generalized atrophy with moderate bilateral chronic microvascular ischemic change throughout the white matter and pons. There was moderate focal stenosis in the  left A1 segment, otherwise no other intracranial abnormalities.  Echocardiogram showed EF of 65% to 70%, no regional wall motion abnormalities, abnormal left ventricular relaxation (grade 1 diastolic dysfunction).  Carotid dopplers showed vertebral arteries appear patent with antegrade flow, 1-39 percent stenosis involving the right internal carotid artery and the left internal carotid artery. Moderate smooth soft plaque versus thrombus in the left proximal ICA.  HbA1c was 5.6, LDL 82.  He was discharged home on full dose aspirin and home doses of anti-hypertensives.  Since hospital discharge, he has been doing well with no further recurrence of similar symptoms.    His girlfriend provides additional information today that around 8-9 months ago he had a similar episode while he was sitting and talking, when he suddenly could not talk and was sitting still with no clear focal weakness.  The episode lasted 3-4 minutes, they called EMS but he was apparently back to baseline and did not go to the hospital.  He denies any headaches, diplopia, blurred vision, neck/back pain.  He has some swallowing difficulties with cough, and over the past 3-4 months has noticed inability to taste and smell his food.  They deny any unresponsive episodes or loss of consciousness, no olfactory/gustatory hallucinations, myoclonic jerks.   PAST MEDICAL HISTORY: Past Medical History  Diagnosis Date  . Hypertension     PAST SURGICAL HISTORY: No past surgical history on file.  MEDICATIONS: Current Outpatient Prescriptions on File Prior to Visit  Medication Sig Dispense Refill  . amLODipine (NORVASC) 5 MG tablet Take  1 tablet (5 mg total) by mouth daily.  30 tablet  1  . aspirin 325 MG tablet Take 1 tablet (325 mg total) by mouth daily.  30 tablet  1  . chlorhexidine (PERIDEX) 0.12 % solution Use as directed 10 mLs in the mouth or throat 2 (two) times daily.       . metoprolol succinate (TOPROL-XL) 50 MG 24 hr tablet Take 50  mg by mouth daily.      . Omega-3 Fatty Acids (FISH OIL PO) Take 1 capsule by mouth every other day.       No current facility-administered medications on file prior to visit.    ALLERGIES: No Known Allergies  FAMILY HISTORY: No family history on file.  SOCIAL HISTORY: History   Social History  . Marital Status: Married    Spouse Name: N/A    Number of Children: N/A  . Years of Education: N/A   Occupational History  . Not on file.   Social History Main Topics  . Smoking status: Former Research scientist (life sciences)  . Smokeless tobacco: Not on file  . Alcohol Use: Yes  . Drug Use: No  . Sexual Activity: No   Other Topics Concern  . Not on file   Social History Narrative  . No narrative on file    REVIEW OF SYSTEMS: Constitutional: No fevers, chills, or sweats, no generalized fatigue, change in appetite Eyes: No visual changes, double vision, eye pain Ear, nose and throat: + loss of taste and smell.  No hearing loss, ear pain, nasal congestion, sore throat Cardiovascular: No chest pain, palpitations Respiratory:  No shortness of breath at rest or with exertion, wheezes GastrointestinaI: No nausea, vomiting, diarrhea, abdominal pain, fecal incontinence Genitourinary:  No dysuria, urinary retention or frequency Musculoskeletal:  No neck pain, back pain Integumentary: No rash, pruritus, skin lesions Neurological: as above Psychiatric: No depression, insomnia, anxiety Endocrine: No palpitations, fatigue, diaphoresis, mood swings, change in appetite, change in weight, increased thirst Hematologic/Lymphatic:  No anemia, purpura, petechiae. Allergic/Immunologic: no itchy/runny eyes, nasal congestion, recent allergic reactions, rashes  PHYSICAL EXAM: Filed Vitals:   10/11/13 1452  BP: 116/70  Pulse: 84  Temp: 97.9 F (36.6 C)   General: No acute distress Head:  Normocephalic/atraumatic Neck: supple, no paraspinal tenderness, full range of motion Back: No paraspinal tenderness Heart:  regular rate and rhythm Lungs: Clear to auscultation bilaterally. Vascular: No carotid bruits. Skin/Extremities: No rash, no edema Neurological Exam: Mental status: alert and oriented to person, place, and time, no dysarthria or aphasia, Fund of knowledge is appropriate.  Recent and remote memory are intact.  Attention and concentration are normal.    Able to name objects and repeat phrases. Cranial nerves: CN I: not tested CN II: pupils equal, round and reactive to light, visual fields intact, fundi unremarkable. CN III, IV, VI:  full range of motion, no nystagmus, no ptosis CN V: facial sensation intact CN VII: upper and lower face symmetric CN VIII: hearing intact CN IX, X: gag intact, uvula midline CN XI: sternocleidomastoid and trapezius muscles intact CN XII: tongue midline Bulk & Tone: normal, no fasciculations. Motor: 5/5 throughout with no pronator drift. Sensation: intact to light touch, cold, pin, vibration and joint position sense.  No extinction to double simultaneous stimulation.  Romberg test negative Deep Tendon Reflexes: +2 throughout except for absent ankle jerks bilaterally, no ankle clonus Plantar responses: downgoing bilaterally Cerebellar: no incoordination on finger to nose, heel to shin. No dysdiadochokinesia Gait: narrow-based and steady, able to  tandem walk adequately. Tremor: none  IMPRESSION: This is a pleasant 79 year old right-handed man with a history of hypertension who had an episode of left-sided weakness and inability to speak on 09/17/2013 that appeared to have a stuttering course with 2 episodes lasting a few minutes each that day. His girlfriend provides additional information of an episode 8-9 months ago where he was also unable to talk for a few minutes with no clear focal weakness at that time.  Certainly TIA is a consideration, TIA workup showed left ICA soft plaque/thrombus with 1-39% stenosis and focal stenosis of the left A1 intracranial artery,  which would not explain left-sided weakness.  Although his LDL is <100, according to [facility] guidelines, statin therapy is recommended to reduce stroke risk in patients with TIA of presumed atherosclerotic origin. He will start simvastatin 20mg /day. Continue daily aspirin and BP control.  Other consideration for recurrent symptoms is seizure, a routine EEG will be ordered.  He knows to go to the ER immediately if symptoms recur.  He will follow-up in 3 months.    Thank you for allowing me to participate in the care of this patient. Please do not hesitate to call for any questions or concerns.   Ellouise Newer, M.D.

## 2013-10-18 ENCOUNTER — Other Ambulatory Visit: Payer: Self-pay | Admitting: *Deleted

## 2013-10-18 DIAGNOSIS — R531 Weakness: Secondary | ICD-10-CM

## 2013-10-25 ENCOUNTER — Ambulatory Visit (INDEPENDENT_AMBULATORY_CARE_PROVIDER_SITE_OTHER): Payer: Medicare Other | Admitting: Neurology

## 2013-10-25 DIAGNOSIS — M6281 Muscle weakness (generalized): Secondary | ICD-10-CM

## 2013-10-25 DIAGNOSIS — R531 Weakness: Secondary | ICD-10-CM

## 2013-10-26 NOTE — Procedures (Signed)
ELECTROENCEPHALOGRAM REPORT  Date of Study: Oct 25 2013  Patient's Name: Andre Silva MRN: [id number] Date of Birth: June 13, 1935  Referring Provider: Dr. Ellouise Newer  Clinical History: This is a 79 year old man with an episode of left-sided weakness and inability to speak on 09/17/2013 that appeared to have a stuttering course with 2 episodes lasting a few minutes each that day. His girlfriend provides additional information of an episode 8-9 months ago where he was also unable to talk for a few minutes with no clear focal weakness at that time.   Medications: Norvasc, aspirin, Toprol-XL  Technical Summary: A multichannel digital EEG recording measured by the international 10-20 system with electrodes applied with paste and impedances below 5000 ohms performed in our laboratory with EKG monitoring in an awake and asleep patient.  Hyperventilation was not performed. Photic stimulation were performed.  The digital EEG was referentially recorded, reformatted, and digitally filtered in a variety of bipolar and referential montages for optimal display.  Spike detection software was employed.  Description: The patient is awake and asleep during the recording.  During maximal wakefulness, there is a symmetric, medium voltage 9 Hz posterior dominant rhythm that attenuates with eye opening.  The record is symmetric.  During drowsiness and stage I sleep, there is an increase in theta slowing of the background with occasional vertex waves seen.  Photic stimulation did not elicit any abnormalities.  There were no epileptiform discharges or electrographic seizures seen.    EKG lead was unremarkable.  Impression: This awake and asleep EEG is normal.    Clinical Correlation: A normal EEG does not exclude a clinical diagnosis of epilepsy.  If further clinical questions remain, prolonged EEG may be helpful.  Clinical correlation is advised.   Ellouise Newer, M.D.

## 2013-10-27 NOTE — Progress Notes (Signed)
Patient came in for EEG. See Procedure notes for EEG results.  

## 2013-12-04 ENCOUNTER — Encounter (INDEPENDENT_AMBULATORY_CARE_PROVIDER_SITE_OTHER): Payer: Medicare Other | Admitting: Ophthalmology

## 2013-12-04 DIAGNOSIS — H35039 Hypertensive retinopathy, unspecified eye: Secondary | ICD-10-CM

## 2013-12-04 DIAGNOSIS — H3530 Unspecified macular degeneration: Secondary | ICD-10-CM

## 2013-12-04 DIAGNOSIS — I10 Essential (primary) hypertension: Secondary | ICD-10-CM

## 2013-12-04 DIAGNOSIS — H33309 Unspecified retinal break, unspecified eye: Secondary | ICD-10-CM

## 2013-12-04 DIAGNOSIS — H43819 Vitreous degeneration, unspecified eye: Secondary | ICD-10-CM

## 2014-12-17 ENCOUNTER — Other Ambulatory Visit: Payer: Self-pay

## 2015-04-05 DIAGNOSIS — C3411 Malignant neoplasm of upper lobe, right bronchus or lung: Secondary | ICD-10-CM | POA: Insufficient documentation

## 2015-04-15 HISTORY — PX: LOBECTOMY: SHX5089

## 2016-10-15 DIAGNOSIS — C3432 Malignant neoplasm of lower lobe, left bronchus or lung: Secondary | ICD-10-CM | POA: Insufficient documentation

## 2016-10-21 ENCOUNTER — Encounter: Payer: Self-pay | Admitting: Radiation Oncology

## 2016-10-22 ENCOUNTER — Ambulatory Visit
Admission: RE | Admit: 2016-10-22 | Discharge: 2016-10-22 | Disposition: A | Discharge status: Home / Self Care | Payer: Non-veteran care | Source: Ambulatory Visit | Attending: Radiation Oncology | Admitting: Radiation Oncology

## 2016-10-22 ENCOUNTER — Other Ambulatory Visit: Payer: Self-pay | Admitting: Radiation Oncology

## 2016-10-22 DIAGNOSIS — C3490 Malignant neoplasm of unspecified part of unspecified bronchus or lung: Secondary | ICD-10-CM

## 2016-10-22 NOTE — Progress Notes (Signed)
Thoracic Location of Tumor / Histology: Hx of stage Ia right upper lobe lung cancer s/p resection in 2016. Now with new concerning 8 mm LUL lung lesion.  Patient presented April 2018 with symptoms of: weight loss and history of lung cancer.  Patient was referred by Dr. Barbarann Ehlers ([place])     Tobacco/Marijuana/Snuff/ETOH use: continues to smoke; 1 pack of cigarettes usually last him one week  Past/Anticipated interventions by cardiothoracic surgery, if any: RUL lobectomy done 04/15/2015  Past/Anticipated interventions by medical oncology, if any: No further intervention past 2016 lobectomy then patient was lost to follow up for 18 months.  Signs/Symptoms  Weight changes, if any: yes, lost so much weight his dentures no longer fit. Patient unable to quantify weight loss and time frame. Reports when he entered the service he weighed 115 lb but, knows his doctors "want him to weigh 130lb."  Respiratory complaints, if any: lost his sense of smell or taste, chronically hoarse, dry cough. Denies shortness of breath.   Hemoptysis, if any: no  Pain issues, if any:  denies  SAFETY ISSUES:  Prior radiation? denies  Pacemaker/ICD? denies   Possible current pregnancy?no  Is the patient on methotrexate? denies  Current Complaints / other details:  82 year old male. Drinks a six pack of beer per week.

## 2016-10-26 ENCOUNTER — Ambulatory Visit
Admission: RE | Admit: 2016-10-26 | Discharge: 2016-10-26 | Disposition: A | Discharge status: Home / Self Care | Payer: Non-veteran care | Source: Ambulatory Visit | Attending: Radiation Oncology | Admitting: Radiation Oncology

## 2016-10-26 ENCOUNTER — Encounter: Payer: Self-pay | Admitting: Radiation Oncology

## 2016-10-26 ENCOUNTER — Telehealth: Payer: Self-pay | Admitting: *Deleted

## 2016-10-26 VITALS — BP 125/70 | HR 77 | Temp 98.3°F | Resp 16 | Ht 67.0 in | Wt 110.0 lb

## 2016-10-26 DIAGNOSIS — Z7982 Long term (current) use of aspirin: Secondary | ICD-10-CM | POA: Insufficient documentation

## 2016-10-26 DIAGNOSIS — R490 Dysphonia: Secondary | ICD-10-CM | POA: Diagnosis not present

## 2016-10-26 DIAGNOSIS — R432 Parageusia: Secondary | ICD-10-CM | POA: Diagnosis not present

## 2016-10-26 DIAGNOSIS — Z902 Acquired absence of lung [part of]: Secondary | ICD-10-CM | POA: Insufficient documentation

## 2016-10-26 DIAGNOSIS — C3432 Malignant neoplasm of lower lobe, left bronchus or lung: Secondary | ICD-10-CM | POA: Insufficient documentation

## 2016-10-26 DIAGNOSIS — I10 Essential (primary) hypertension: Secondary | ICD-10-CM | POA: Diagnosis not present

## 2016-10-26 DIAGNOSIS — C3411 Malignant neoplasm of upper lobe, right bronchus or lung: Secondary | ICD-10-CM | POA: Insufficient documentation

## 2016-10-26 DIAGNOSIS — F17210 Nicotine dependence, cigarettes, uncomplicated: Secondary | ICD-10-CM | POA: Insufficient documentation

## 2016-10-26 DIAGNOSIS — R439 Unspecified disturbances of smell and taste: Secondary | ICD-10-CM

## 2016-10-26 HISTORY — DX: Unspecified urinary incontinence: R32

## 2016-10-26 HISTORY — DX: Pain in unspecified knee: M25.569

## 2016-10-26 HISTORY — DX: Corns and callosities: L84

## 2016-10-26 HISTORY — DX: Anosmia: R43.0

## 2016-10-26 HISTORY — DX: Dysphagia, unspecified: R13.10

## 2016-10-26 HISTORY — DX: Follicular cyst of the skin and subcutaneous tissue, unspecified: L72.9

## 2016-10-26 HISTORY — DX: Low back pain: M54.5

## 2016-10-26 HISTORY — DX: Malignant neoplasm of unspecified part of unspecified bronchus or lung: C34.90

## 2016-10-26 HISTORY — DX: Low back pain, unspecified: M54.50

## 2016-10-26 HISTORY — DX: Transient cerebral ischemic attack, unspecified: G45.9

## 2016-10-26 NOTE — Progress Notes (Signed)
Radiation Oncology         [phone] ________________________________  Initial Outpatient Consultation  Name: Andre Silva MRN: [id number]  Date: 10/26/2016  DOB: 1935/05/26  LF:YBOFBPZ, Christean Grief, MD  Skiver, Dorathy Daft, MD   REFERRING PHYSICIAN: Skiver, Dorathy Daft, MD  DIAGNOSIS: The primary encounter diagnosis was Presumed primary cancer of left lower lobe of lung ([place]). Diagnoses of Primary cancer of right upper lobe of lung ([place]), Primary cancer of left lower lobe of lung ([place]), Loss of taste, Olfactory impairment, and Hoarseness were also pertinent to this visit.    ICD-9-CM ICD-10-CM   1. Presumed primary cancer of left lower lobe of lung (HCC) 162.5 C34.32 NM PET Image Restag (PS) Skull Base To Thigh  2. Primary cancer of right upper lobe of lung (HCC) 162.3 C34.11   3. Primary cancer of left lower lobe of lung (HCC) 162.5 C34.32   4. Loss of taste 781.1 R43.2   5. Olfactory impairment 781.1 R43.9   6. Hoarseness 784.42 R49.0    83 y.o. gentleman with putative Stage IA cT1aN0 NSCLC of the left lower lobe  HISTORY OF PRESENT ILLNESS: Andre Silva is a 82 y.o. male who underwent a right upper lung lobectomy on 04/15/2015 at the [facility] in [place], [place]. Pathology revealed two separate synchronous primaries, both measuring 1.6 cm. 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 0 out of 24 lymph nodes were negative for carcinoma. pT1a(2), pN0      The patient was followed serial imaging until December 2016 when he was NED. He was lost to follow up for close to 18 months. The patient underwent CT CAP on 10/15/2016 and this revealed a new 0.8 cm nodule in the left lower lobe with irregular margins suspected for malignancy, a lymph node along the medial aspect of the RUL pleura measuring 0.8 (previously measuring 0.6 cm), and emphysema with postsurgical changes of a right upper lobectomy. Possible adenopathy was noted in the  left hilum and subcarinal region. The patient saw Dr. Domenica Fail at the [facility] on 10/19/16. He reports though he's not lost weight, he's had his dentures so long, that they do not fit. He also has a chronic dry cough and denied hemoptysis. He was still smoking 1 pk/week and drank about a 6 pack of beer/week. At the time, the patient expressed interest in smoking cessation and he was prescribed nicotine patches. He apparently has been seen in ENT for his hoarseness as well and reports that the physicians didn't seen any abnormalities.     The patient presents today with his wife to discuss the role of radiation for the management of his presumed disease. Of note he has also undergone Brain MRI which was negative for disease.  PREVIOUS RADIATION THERAPY: No  PAST MEDICAL HISTORY:  Past Medical History:  Diagnosis Date  . Absent sense of smell   . Corns and callosities   . Hypertension   . Intermittent cerebral ischemia   . Knee joint pain   . Low back pain   . Lung cancer ([place])   . Problems with swallowing and mastication   . Skin cyst   . Unspecified urinary incontinence       PAST SURGICAL HISTORY: Past Surgical History:  Procedure Laterality Date  . LOBECTOMY Right 04/15/2015   RUL lobectomy    FAMILY HISTORY:  Family History  Problem Relation Age of Onset  . Breast cancer Mother     SOCIAL  HISTORY:  Social History   Social History  . Marital status: Married    Spouse name: N/A  . Number of children: N/A  . Years of education: N/A   Occupational History  . Not on file.   Social History Main Topics  . Smoking status: Current Every Day Smoker    Packs/day: 0.25    Years: 68.00    Types: Cigarettes  . Smokeless tobacco: Never Used  . Alcohol use 3.6 oz/week    6 Cans of beer per week     Comment: Six pack of beer per week  . Drug use: No  . Sexual activity: No   Other Topics Concern  . Not on file   Social History Narrative  . No narrative on file  The patient  is married and presents with his wife. 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   ALLERGIES: Patient has no known allergies.  MEDICATIONS:  Current Outpatient Prescriptions  Medication Sig Dispense Refill  . amLODipine (NORVASC) 5 MG tablet Take 1 tablet (5 mg total) by mouth daily. 30 tablet 1  . aspirin 325 MG tablet Take 1 tablet (325 mg total) by mouth daily. 30 tablet 1  . chlorhexidine (PERIDEX) 0.12 % solution Use as directed 10 mLs in the mouth or throat 2 (two) times daily.     . metoprolol succinate (TOPROL-XL) 50 MG 24 hr tablet Take 50 mg by mouth daily.    . Omega-3 Fatty Acids (FISH OIL PO) Take 1 capsule by mouth every other day.    . simvastatin (ZOCOR) 20 MG tablet Take 1 tablet (20 mg total) by mouth daily. 30 tablet 6   No current facility-administered medications for this encounter.     REVIEW OF SYSTEMS:  On review of systems, the patient reports that he is doing well overall. He denies any chest pain, shortness of breath, fevers, chills, or night sweats. He denies any bowel or bladder disturbances, and denies abdominal pain, nausea or vomiting. He denies any new musculoskeletal or joint aches or pains. He reports a chronic dry cough and weight loss. He reports he always has had a low weight. He has difficulty chewing food due to his dentures not fitting well. He denies dysphagia or odynophagia. He has lost his olfactory sense and taste about one year ago but dues not recall the event that led to this. He reports difficulty speaking due to the dentures and somewhat of a hoarse voice. He also reports significant post-nasal drip. He reports seeing ENT at the [place]. A complete review of systems is obtained and is otherwise negative.    PHYSICAL EXAM:  Wt Readings from Last 3 Encounters:  10/26/16 110 lb (49.9 kg)  10/11/13 113 lb 1.6 oz (51.3 kg)   Temp Readings from Last 3 Encounters:  10/26/16 98.3 F (36.8 C) (Oral)    10/11/13 97.9 F (36.6 C) (Oral)  09/18/13 98.4 F (36.9 C) (Oral)   BP Readings from Last 3 Encounters:  10/26/16 125/70  10/11/13 116/70  09/18/13 125/66   Pulse Readings from Last 3 Encounters:  10/26/16 77  10/11/13 84  09/18/13 75   Pain Assessment Pain Score: 0-No pain/10  In general this is a thin, elderly appearing African American male in no acute distress. His voice is hoarse and he clears his throat repeatedly during our discussion. He is alert and oriented x4 and appropriate throughout the examination. HEENT reveals that the patient is  normocephalic, atraumatic. EOMs are intact. PERRLA. Skin is intact without any evidence of gross lesions. Cardiovascular exam reveals a regular rate and rhythm, no clicks rubs or murmurs are auscultated. Chest is notable for wheezing in the left lung base without consolidation, there are coarse breath sounds throughout the right lung. Lymphatic assessment is performed and does not reveal any adenopathy in the cervical, supraclavicular, axillary, or inguinal chains. Abdomen has active bowel sounds in all quadrants and is intact. The abdomen is soft, non tender, non distended. Lower extremities are negative for pretibial pitting edema, deep calf tenderness, cyanosis or clubbing.   KPS = 90  100 - Normal; no complaints; no evidence of disease. 90   - Able to carry on normal activity; minor signs or symptoms of disease. 80   - Normal activity with effort; some signs or symptoms of disease. 43   - Cares for self; unable to carry on normal activity or to do active work. 60   - Requires occasional assistance, but is able to care for most of his personal needs. 50   - Requires considerable assistance and frequent medical care. 22   - Disabled; requires special care and assistance. 49   - Severely disabled; hospital admission is indicated although death not imminent. 92   - Very sick; hospital admission necessary; active supportive treatment  necessary. 10   - Moribund; fatal processes progressing rapidly. 0     - Dead  Karnofsky DA, Abelmann [place], Craver LS and Burchenal [facility] [phone]) The use of the nitrogen mustards in the palliative treatment of carcinoma: with particular reference to bronchogenic carcinoma Cancer 1 634-56  LABORATORY DATA:  Lab Results  Component Value Date   WBC 2.9 (L) 09/18/2013   HGB 12.2 (L) 09/18/2013   HCT 35.0 (L) 09/18/2013   MCV 98.3 09/18/2013   PLT 207 09/18/2013   Lab Results  Component Value Date   NA 141 09/18/2013   K 4.3 09/18/2013   CL 103 09/18/2013   CO2 24 09/18/2013   Lab Results  Component Value Date   ALT 11 09/17/2013   AST 22 09/17/2013   ALKPHOS 60 09/17/2013   BILITOT 0.7 09/17/2013     RADIOGRAPHY: No results found.    IMPRESSION/PLAN: 1. 82 y.o. entleman with putative Stage IA cT1aN0 NSCLC of the left lower lobe. We reviewed the findings from the imaging studies and previous pathology from his prior cancer. Given the changes seen on imaging there are concerns for a new primary NSCLC in the left lower lobe. We recommend proceeding with a PET scan and to discuss options for biopsy. At this time, the patient is not interested in proceeding with additional procedures. We will plan to review his PET scan results. If he still has clinical stage IA disease on imaging, we would offer a course of SBRT to the left lower lobe lesion. If he has additional sites of concern, we will present his case at our thoracic oncology conference. If we proceed with SBRT, we would offer the patient a course of 3 fractions. We discussed the risks, benefits, short, and long term effects of radiotherapy. He reviews the delivery and logistics of treatment as well. At the end of the discussion, the patient would be interested in SBRT if appropriate. We will meet back to review his PET scan  2. History of synchronous Stage IA pT1aN0 NSCLC, adenocarcinoma and squamous cell carcinoma of the right upper lobe.  The patient has done well since his right upper  lobectomy in 2016 and will continue in surveillance once we complete treatment for #1.  3. Hoarseness, loss of olfactory and taste. We will obtain records from ENT at the [facility] and evaluate his pharynx as well by PET. We will follow this expectantly.    Carola Rhine, PAC  and  Sheral Apley Tammi Klippel, M.D.  This document serves as a record of services personally performed by Shona Simpson, PA-C and Tyler Pita, MD. It was created on their behalf by Darcus Austin, a trained medical scribe. The creation of this record is based on the scribe's personal observations and the providers' statements to them. This document has been checked and approved by the attending provider.

## 2016-10-26 NOTE — Telephone Encounter (Signed)
XXXX 

## 2016-10-26 NOTE — Progress Notes (Signed)
See progress note under physician encounter. 

## 2016-12-01 ENCOUNTER — Ambulatory Visit (HOSPITAL_COMMUNITY)
Admission: RE | Admit: 2016-12-01 | Discharge: 2016-12-01 | Disposition: A | Discharge status: Home / Self Care | Payer: Non-veteran care | Source: Ambulatory Visit | Attending: Radiation Oncology | Admitting: Radiation Oncology

## 2016-12-01 DIAGNOSIS — I2510 Atherosclerotic heart disease of native coronary artery without angina pectoris: Secondary | ICD-10-CM | POA: Insufficient documentation

## 2016-12-01 DIAGNOSIS — K8020 Calculus of gallbladder without cholecystitis without obstruction: Secondary | ICD-10-CM | POA: Diagnosis not present

## 2016-12-01 DIAGNOSIS — J439 Emphysema, unspecified: Secondary | ICD-10-CM | POA: Diagnosis not present

## 2016-12-01 DIAGNOSIS — C3432 Malignant neoplasm of lower lobe, left bronchus or lung: Secondary | ICD-10-CM | POA: Insufficient documentation

## 2016-12-01 DIAGNOSIS — R911 Solitary pulmonary nodule: Secondary | ICD-10-CM | POA: Insufficient documentation

## 2016-12-01 DIAGNOSIS — I700 Atherosclerosis of aorta: Secondary | ICD-10-CM | POA: Insufficient documentation

## 2016-12-01 LAB — GLUCOSE, CAPILLARY: GLUCOSE-CAPILLARY: 112 mg/dL — AB (ref 65–99)

## 2016-12-01 MED ORDER — FLUDEOXYGLUCOSE F - 18 (FDG) INJECTION
5.7000 | Freq: Once | INTRAVENOUS | Status: AC | PRN
  Administered 2016-12-01: 5.7 via INTRAVENOUS

## 2016-12-07 ENCOUNTER — Telehealth: Payer: Self-pay | Admitting: Radiation Oncology

## 2016-12-07 NOTE — Telephone Encounter (Signed)
I called the patient but there was no voicemail set up and I will try again to reach him.

## 2016-12-07 NOTE — Telephone Encounter (Signed)
Phoned patient as requested by Shona Simpson, PA-C. Explained they plan to review his PET scan at the conference on Thursday morning and she will call him afterwards with the results. Patient states, "I will not be able to come up there on Thursday I got another appointment." Reinforced that Andre Silva will call him with the results after conference on Thursday. Patient verbalized understanding.

## 2016-12-10 ENCOUNTER — Telehealth: Payer: Self-pay | Admitting: Radiation Oncology

## 2016-12-10 ENCOUNTER — Telehealth: Payer: Self-pay | Admitting: *Deleted

## 2016-12-10 ENCOUNTER — Other Ambulatory Visit: Payer: Self-pay | Admitting: *Deleted

## 2016-12-10 NOTE — Telephone Encounter (Signed)
I called the patient to let him know his case was discussed in conference and recommendations were to move forward with SBRT to the LLL lesion. We will contact him to set up radiotherapy and we reviewed the risks, benefits, delivery and after reviewing this, the patient has concerns and would like to meet face to face to review this information.

## 2016-12-10 NOTE — Telephone Encounter (Signed)
On 12-10-16 [facility], it was consult note

## 2017-02-22 ENCOUNTER — Other Ambulatory Visit: Payer: Self-pay | Admitting: Radiation Oncology

## 2017-02-22 DIAGNOSIS — C801 Malignant (primary) neoplasm, unspecified: Secondary | ICD-10-CM

## 2017-02-23 ENCOUNTER — Inpatient Hospital Stay
Admission: RE | Admit: 2017-02-23 | Discharge: 2017-02-23 | Disposition: A | Discharge status: Home / Self Care | Payer: Self-pay | Source: Ambulatory Visit | Attending: Radiation Oncology | Admitting: Radiation Oncology

## 2017-02-23 DIAGNOSIS — C801 Malignant (primary) neoplasm, unspecified: Secondary | ICD-10-CM

## 2017-03-11 ENCOUNTER — Ambulatory Visit: Payer: Non-veteran care

## 2017-03-11 ENCOUNTER — Ambulatory Visit
Admission: RE | Admit: 2017-03-11 | Discharge: 2017-03-11 | Disposition: A | Discharge status: Home / Self Care | Payer: Non-veteran care | Source: Ambulatory Visit | Attending: Radiation Oncology | Admitting: Radiation Oncology

## 2017-03-11 DIAGNOSIS — C3432 Malignant neoplasm of lower lobe, left bronchus or lung: Secondary | ICD-10-CM | POA: Insufficient documentation

## 2017-03-11 DIAGNOSIS — I10 Essential (primary) hypertension: Secondary | ICD-10-CM | POA: Insufficient documentation

## 2017-03-11 DIAGNOSIS — F17210 Nicotine dependence, cigarettes, uncomplicated: Secondary | ICD-10-CM | POA: Insufficient documentation

## 2017-03-11 DIAGNOSIS — R432 Parageusia: Secondary | ICD-10-CM | POA: Insufficient documentation

## 2017-03-11 DIAGNOSIS — Z7982 Long term (current) use of aspirin: Secondary | ICD-10-CM | POA: Insufficient documentation

## 2017-03-11 DIAGNOSIS — Z902 Acquired absence of lung [part of]: Secondary | ICD-10-CM | POA: Insufficient documentation

## 2017-03-11 DIAGNOSIS — R490 Dysphonia: Secondary | ICD-10-CM | POA: Insufficient documentation

## 2017-03-11 DIAGNOSIS — C3411 Malignant neoplasm of upper lobe, right bronchus or lung: Secondary | ICD-10-CM | POA: Insufficient documentation

## 2017-03-11 NOTE — Progress Notes (Addendum)
Patient did not come to his appointment today. Tried to call several time went to voicemail but,voicemail was not set up so could not leave a message.Called Melanie to see if she could reschedule his appointment.

## 2017-03-15 ENCOUNTER — Telehealth: Payer: Self-pay | Admitting: Radiation Oncology

## 2017-03-15 NOTE — Telephone Encounter (Signed)
-----   Message from Hayden Pedro, [place] sent at 03/12/2017  5:27 AM EDT ----- Mr. Andre Silva was supposed to get treated in June with SBRT to his lung- I don't know what happened to him, then he showed up on my schedule yesterday and again no showed. Can you see if he wants to come in and have him come on a Monday so Dr. Tammi Klippel can be here with me to see him?

## 2017-03-15 NOTE — Telephone Encounter (Signed)
All numbers for the patient and his emergency contacts are invalid.

## 2017-03-16 ENCOUNTER — Telehealth: Payer: Self-pay | Admitting: Radiation Oncology

## 2017-03-16 NOTE — Telephone Encounter (Signed)
I called to leave a message to try to reschedule the patient's missed appointment. His VM is not set up and our nurses have reached out to him without success. I will copy his PCP as well.

## 2017-04-16 ENCOUNTER — Telehealth: Payer: Self-pay | Admitting: Radiation Oncology

## 2017-04-16 NOTE — Telephone Encounter (Signed)
[facility] attempting to obtain authorization for PET scan and requesting consult notes. Faxed notes as requested to [phone]. Fax confirmation of delivery obtained.

## 2017-11-05 ENCOUNTER — Emergency Department (HOSPITAL_COMMUNITY): Payer: Medicare PPO

## 2017-11-05 ENCOUNTER — Inpatient Hospital Stay (HOSPITAL_COMMUNITY)
Admission: EM | Admit: 2017-11-05 | Discharge: 2017-11-09 | DRG: 469 | Disposition: A | Discharge status: Skilled Nursing Facility | Payer: Medicare PPO | Attending: Internal Medicine | Admitting: Internal Medicine

## 2017-11-05 ENCOUNTER — Encounter (HOSPITAL_COMMUNITY): Payer: Self-pay

## 2017-11-05 ENCOUNTER — Other Ambulatory Visit: Payer: Self-pay

## 2017-11-05 ENCOUNTER — Inpatient Hospital Stay (HOSPITAL_COMMUNITY): Payer: Medicare PPO

## 2017-11-05 DIAGNOSIS — Z96642 Presence of left artificial hip joint: Secondary | ICD-10-CM

## 2017-11-05 DIAGNOSIS — Z681 Body mass index (BMI) 19 or less, adult: Secondary | ICD-10-CM

## 2017-11-05 DIAGNOSIS — E785 Hyperlipidemia, unspecified: Secondary | ICD-10-CM | POA: Diagnosis present

## 2017-11-05 DIAGNOSIS — Z902 Acquired absence of lung [part of]: Secondary | ICD-10-CM | POA: Diagnosis not present

## 2017-11-05 DIAGNOSIS — S72002A Fracture of unspecified part of neck of left femur, initial encounter for closed fracture: Secondary | ICD-10-CM | POA: Diagnosis present

## 2017-11-05 DIAGNOSIS — Z7982 Long term (current) use of aspirin: Secondary | ICD-10-CM | POA: Diagnosis not present

## 2017-11-05 DIAGNOSIS — Z419 Encounter for procedure for purposes other than remedying health state, unspecified: Secondary | ICD-10-CM

## 2017-11-05 DIAGNOSIS — F17210 Nicotine dependence, cigarettes, uncomplicated: Secondary | ICD-10-CM | POA: Diagnosis present

## 2017-11-05 DIAGNOSIS — R1310 Dysphagia, unspecified: Secondary | ICD-10-CM | POA: Diagnosis present

## 2017-11-05 DIAGNOSIS — I10 Essential (primary) hypertension: Secondary | ICD-10-CM | POA: Diagnosis present

## 2017-11-05 DIAGNOSIS — J449 Chronic obstructive pulmonary disease, unspecified: Secondary | ICD-10-CM | POA: Diagnosis present

## 2017-11-05 DIAGNOSIS — C3432 Malignant neoplasm of lower lobe, left bronchus or lung: Secondary | ICD-10-CM | POA: Diagnosis present

## 2017-11-05 DIAGNOSIS — Z85118 Personal history of other malignant neoplasm of bronchus and lung: Secondary | ICD-10-CM

## 2017-11-05 DIAGNOSIS — I700 Atherosclerosis of aorta: Secondary | ICD-10-CM | POA: Diagnosis present

## 2017-11-05 DIAGNOSIS — D62 Acute posthemorrhagic anemia: Secondary | ICD-10-CM | POA: Diagnosis not present

## 2017-11-05 DIAGNOSIS — E43 Unspecified severe protein-calorie malnutrition: Secondary | ICD-10-CM

## 2017-11-05 DIAGNOSIS — M79652 Pain in left thigh: Secondary | ICD-10-CM | POA: Diagnosis present

## 2017-11-05 DIAGNOSIS — R509 Fever, unspecified: Secondary | ICD-10-CM

## 2017-11-05 DIAGNOSIS — C3411 Malignant neoplasm of upper lobe, right bronchus or lung: Secondary | ICD-10-CM

## 2017-11-05 DIAGNOSIS — I2510 Atherosclerotic heart disease of native coronary artery without angina pectoris: Secondary | ICD-10-CM | POA: Diagnosis present

## 2017-11-05 DIAGNOSIS — W1830XA Fall on same level, unspecified, initial encounter: Secondary | ICD-10-CM | POA: Diagnosis present

## 2017-11-05 DIAGNOSIS — R1312 Dysphagia, oropharyngeal phase: Secondary | ICD-10-CM | POA: Diagnosis present

## 2017-11-05 DIAGNOSIS — Y92009 Unspecified place in unspecified non-institutional (private) residence as the place of occurrence of the external cause: Secondary | ICD-10-CM | POA: Diagnosis not present

## 2017-11-05 LAB — CBC WITH DIFFERENTIAL/PLATELET
BASOS ABS: 0 10*3/uL (ref 0.0–0.1)
Basophils Relative: 0 %
Eosinophils Absolute: 0 10*3/uL (ref 0.0–0.7)
Eosinophils Relative: 0 %
HCT: 34.8 % — ABNORMAL LOW (ref 39.0–52.0)
HEMOGLOBIN: 11.2 g/dL — AB (ref 13.0–17.0)
LYMPHS ABS: 0.9 10*3/uL (ref 0.7–4.0)
LYMPHS PCT: 11 %
MCH: 28.9 pg (ref 26.0–34.0)
MCHC: 32.2 g/dL (ref 30.0–36.0)
MCV: 89.7 fL (ref 78.0–100.0)
Monocytes Absolute: 0.4 10*3/uL (ref 0.1–1.0)
Monocytes Relative: 5 %
NEUTROS ABS: 6.4 10*3/uL (ref 1.7–7.7)
NEUTROS PCT: 84 %
Platelets: 180 10*3/uL (ref 150–400)
RBC: 3.88 MIL/uL — AB (ref 4.22–5.81)
RDW: 19 % — ABNORMAL HIGH (ref 11.5–15.5)
WBC: 7.7 10*3/uL (ref 4.0–10.5)

## 2017-11-05 LAB — COMPREHENSIVE METABOLIC PANEL
ALK PHOS: 52 U/L (ref 38–126)
ALT: 14 U/L — AB (ref 17–63)
ANION GAP: 11 (ref 5–15)
AST: 19 U/L (ref 15–41)
Albumin: 3.9 g/dL (ref 3.5–5.0)
BUN: 14 mg/dL (ref 6–20)
CHLORIDE: 106 mmol/L (ref 101–111)
CO2: 27 mmol/L (ref 22–32)
Calcium: 9.2 mg/dL (ref 8.9–10.3)
Creatinine, Ser: 0.75 mg/dL (ref 0.61–1.24)
GFR calc Af Amer: >60 mL/min (ref >60)
GLUCOSE: 116 mg/dL — AB (ref 65–99)
POTASSIUM: 3.6 mmol/L (ref 3.5–5.1)
Sodium: 144 mmol/L (ref 135–145)
Total Bilirubin: 0.8 mg/dL (ref 0.3–1.2)
Total Protein: 7.3 g/dL (ref 6.5–8.1)

## 2017-11-05 LAB — TYPE AND SCREEN
ABO/RH(D): A POS
Antibody Screen: NEGATIVE

## 2017-11-05 MED ORDER — HYDROCODONE-ACETAMINOPHEN 5-325 MG PO TABS
1.0000 | ORAL_TABLET | Freq: Four times a day (QID) | ORAL | Status: DC | PRN
  Administered 2017-11-05: 1 via ORAL
  Filled 2017-11-05: qty 1

## 2017-11-05 MED ORDER — ATORVASTATIN CALCIUM 40 MG PO TABS
40.0000 mg | ORAL_TABLET | Freq: Every day | ORAL | Status: DC
  Administered 2017-11-05 – 2017-11-06 (×2): 40 mg via ORAL
  Filled 2017-11-05 (×2): qty 1

## 2017-11-05 MED ORDER — AMLODIPINE BESYLATE 5 MG PO TABS
5.0000 mg | ORAL_TABLET | Freq: Every day | ORAL | Status: DC
  Administered 2017-11-06 – 2017-11-07 (×2): 5 mg via ORAL
  Filled 2017-11-05 (×2): qty 1

## 2017-11-05 MED ORDER — TAMSULOSIN HCL 0.4 MG PO CAPS: 0.4000 mg | ORAL_CAPSULE | Freq: Every day | ORAL | Status: DC

## 2017-11-05 MED ORDER — CHLORHEXIDINE GLUCONATE 4 % EX LIQD
60.0000 mL | Freq: Once | CUTANEOUS | Status: AC
  Administered 2017-11-06: 4 via TOPICAL

## 2017-11-05 MED ORDER — NICOTINE 7 MG/24HR TD PT24: 7.0000 mg | MEDICATED_PATCH | Freq: Every day | TRANSDERMAL | Status: DC

## 2017-11-05 MED ORDER — TAMSULOSIN HCL 0.4 MG PO CAPS
0.4000 mg | ORAL_CAPSULE | Freq: Every day | ORAL | Status: DC
  Administered 2017-11-06 – 2017-11-07 (×2): 0.4 mg via ORAL
  Filled 2017-11-05 (×2): qty 1

## 2017-11-05 MED ORDER — AMLODIPINE BESYLATE 5 MG PO TABS: 5.0000 mg | ORAL_TABLET | Freq: Every day | ORAL | Status: DC

## 2017-11-05 MED ORDER — FAMOTIDINE 20 MG PO TABS
40.0000 mg | ORAL_TABLET | Freq: Every day | ORAL | Status: DC
  Administered 2017-11-05 – 2017-11-06 (×2): 40 mg via ORAL
  Filled 2017-11-05 (×3): qty 2

## 2017-11-05 MED ORDER — METOPROLOL TARTRATE 25 MG PO TABS
25.0000 mg | ORAL_TABLET | Freq: Two times a day (BID) | ORAL | Status: DC
  Administered 2017-11-05 – 2017-11-06 (×2): 25 mg via ORAL
  Filled 2017-11-05 (×4): qty 1

## 2017-11-05 MED ORDER — PANTOPRAZOLE SODIUM 40 MG PO TBEC
40.0000 mg | DELAYED_RELEASE_TABLET | Freq: Two times a day (BID) | ORAL | Status: DC
  Administered 2017-11-05 – 2017-11-07 (×4): 40 mg via ORAL
  Filled 2017-11-05 (×4): qty 1

## 2017-11-05 MED ORDER — MORPHINE SULFATE (PF) 2 MG/ML IV SOLN: 0.5000 mg | INTRAVENOUS | Status: DC | PRN

## 2017-11-05 NOTE — ED Notes (Signed)
ED TO INPATIENT HANDOFF REPORT  Name/Age/Gender Andre Silva 83 y.o. male  Code Status    Code Status Orders  (From admission, onward)        Start     Ordered   11/05/17 1957  Full code  Continuous     11/05/17 1959    Code Status History    Date Active Date Inactive Code Status Order ID Comments User Context   09/17/2013 1858 09/18/2013 2105 Full Code [id number]  Robbie Lis, MD Inpatient    Advance Directive Documentation     Most Recent Value  Type of Advance Directive  Healthcare Power of Attorney  Pre-existing out of facility DNR order (yellow form or pink MOST form)  -  "MOST" Form in Place?  -      Home/SNF/Other Home  Chief Complaint Fall; Leg Pain  Level of Care/Admitting Diagnosis ED Disposition    ED Disposition Condition [place]: [place] [100102]  Level of Care: Med-Surg [16]  Diagnosis: Closed left hip fracture, initial encounter [facility]) [160109]  Admitting Physician: Doreatha Massed  Attending Physician: Etta Quill [phone]  Estimated length of stay: past midnight tomorrow  Certification:: I certify this patient will need inpatient services for at least 2 midnights  PT Class (Do Not Modify): Inpatient [101]  PT Acc Code (Do Not Modify): Private [1]       Medical History Past Medical History:  Diagnosis Date  . Absent sense of smell   . Corns and callosities   . Hypertension   . Intermittent cerebral ischemia   . Knee joint pain   . Low back pain   . Lung cancer ([place])   . Problems with swallowing and mastication   . Skin cyst   . Unspecified urinary incontinence     Allergies No Known Allergies  IV Location/Drains/Wounds Patient Lines/Drains/Airways Status   Active Line/Drains/Airways    None          Labs/Imaging Results for orders placed or performed during the hospital encounter of 11/05/17 (from the past 48 hour(s))  CBC with Differential     Status: Abnormal    Collection Time: 11/05/17  4:40 PM  Result Value Ref Range   WBC 7.7 4.0 - 10.5 K/uL   RBC 3.88 (L) 4.22 - 5.81 MIL/uL   Hemoglobin 11.2 (L) 13.0 - 17.0 g/dL   HCT 34.8 (L) 39.0 - 52.0 %   MCV 89.7 78.0 - 100.0 fL   MCH 28.9 26.0 - 34.0 pg   MCHC 32.2 30.0 - 36.0 g/dL   RDW 19.0 (H) 11.5 - 15.5 %   Platelets 180 150 - 400 K/uL   Neutrophils Relative % 84 %   Neutro Abs 6.4 1.7 - 7.7 K/uL   Lymphocytes Relative 11 %   Lymphs Abs 0.9 0.7 - 4.0 K/uL   Monocytes Relative 5 %   Monocytes Absolute 0.4 0.1 - 1.0 K/uL   Eosinophils Relative 0 %   Eosinophils Absolute 0.0 0.0 - 0.7 K/uL   Basophils Relative 0 %   Basophils Absolute 0.0 0.0 - 0.1 K/uL    Comment: Performed at [facility], [place] [address] [place], [place] [address]  Comprehensive metabolic panel     Status: Abnormal   Collection Time: 11/05/17  4:40 PM  Result Value Ref Range   Sodium 144 135 - 145 mmol/L   Potassium 3.6 3.5 - 5.1 mmol/L   Chloride 106  101 - 111 mmol/L   CO2 27 22 - 32 mmol/L   Glucose, Bld 116 (H) 65 - 99 mg/dL   BUN 14 6 - 20 mg/dL   Creatinine, Ser 0.75 0.61 - 1.24 mg/dL   Calcium 9.2 8.9 - 10.3 mg/dL   Total Protein 7.3 6.5 - 8.1 g/dL   Albumin 3.9 3.5 - 5.0 g/dL   AST 19 15 - 41 U/L   ALT 14 (L) 17 - 63 U/L   Alkaline Phosphatase 52 38 - 126 U/L   Total Bilirubin 0.8 0.3 - 1.2 mg/dL   GFR calc non Af Amer >60 >60 mL/min   GFR calc Af Amer >60 >60 mL/min    Comment: (NOTE) The eGFR has been calculated using the CKD EPI equation. This calculation has not been validated in all clinical situations. eGFR's persistently <60 mL/min signify possible Chronic Kidney Disease.    Anion gap 11 5 - 15    Comment: Performed at [facility], [place] [address], [place], [place] [address]   Dg Chest 2 View  Result Date: 11/05/2017 CLINICAL DATA:  Weakness.  Productive cough for 2 days. EXAM: CHEST - 2 VIEW COMPARISON:  09/04/2005. FINDINGS: Cardiac silhouette is  normal in size. No mediastinal or hilar masses. There are surgical vascular clips projecting over the right hilum, new since the prior chest radiographs, stable from a PET-CT dated 12/01/2016. Lungs are hyperexpanded but clear. No pleural effusion or pneumothorax. Skeletal structures are demineralized. IMPRESSION: No acute cardiopulmonary disease. Hyperexpanded lungs consistent with COPD. Electronically Signed   By: Lajean Manes M.D.   On: 11/05/2017 17:30   Dg Femur Min 2 Views Left  Result Date: 11/05/2017 CLINICAL DATA:  Fall.  Left thigh pain. EXAM: LEFT FEMUR 2 VIEWS COMPARISON:  No recent prior. FINDINGS: Peripheral vascular calcification. Degenerative change left hip and knee. Displaced left femoral neck fracture is present. IMPRESSION: 1.  Displaced left femoral neck fracture. 2.  Degenerative changes left hip and knee. 3.  Peripheral vascular disease. Electronically Signed   By: Marcello Moores  Register   On: 11/05/2017 15:13    Pending Labs Unresulted Labs (From admission, onward)   Start     Ordered   11/05/17 2000  Type and screen [facility]  Once,   R    Comments:  [place]od[place]ia    11/05/17 1959      Vitals/Pain Today's Vitals   11/05/17 1700 11/05/17 1919 11/05/17 1930 11/05/17 2000  BP: (!) 142/82 (!) 151/91 (!) 148/87 140/80  Pulse: 99 (!) 110 (!) 107 (!) 107  Resp: 18 16    Temp:  98.2 F (36.8 C)    TempSrc:  Oral    SpO2: 99% 94% 97% 98%  PainSc:        Isolation Precautions No active isolations  Medications Medications  HYDROcodone-acetaminophen (NORCO/VICODIN) 5-325 MG per tablet 1-2 tablet (has no administration in time range)  morphine 2 MG/ML injection 0.5 mg (has no administration in time range)  metoprolol tartrate (LOPRESSOR) tablet 25 mg (has no administration in time range)  pantoprazole (PROTONIX) EC tablet 40 mg (has no administration in time range)  famotidine (PEPCID) tablet 40 mg (has no administration in time  range)  atorvastatin (LIPITOR) tablet 40 mg (has no administration in time range)  amLODipine (NORVASC) tablet 5 mg (has no administration in time range)  tamsulosin (FLOMAX) capsule 0.4 mg (has no administration in time range)    Mobility walks

## 2017-11-05 NOTE — ED Triage Notes (Addendum)
Pt reports L thigh pain following a mechanical fall at home. Denies neck, back pain, no head injury or LOC. He states that it is most painful when bearing weight. No shortening or rotation. He does have a speech impediment at baseline

## 2017-11-05 NOTE — Consult Note (Signed)
Reason for Consult:  Left hip fracture Referring Physician:   Elvina Sidle MD/EDP  Andre Silva is an 83 y.o. male.  HPI: Patient is a very pleasant 83 year old gentleman who sustained an accidental mechanical fall at home today.  He denies any loss of consciousness.  After the fall he felt severe left hip pain and inability to ambulate on the left hip.  He denies any previous left hip issues.  He occasionally ambulates with a cane and does drive on his own and mobilizes out in the community.  He denies any chest pain or shortness of breath.  After the inability to ambulate and with severe left hip pain is brought to the [place] on the emergency room and found to have a displaced left hip femoral neck fracture.  Both orthopedic surgery and internal medicine are consulted for evaluation and treatment.  The patient reports only significant left hip pain and denies any other injuries as a relates to chief complaint of left hip pain.  Past Medical History:  Diagnosis Date  . Absent sense of smell   . Corns and callosities   . Hypertension   . Intermittent cerebral ischemia   . Knee joint pain   . Low back pain   . Lung cancer ([place])   . Problems with swallowing and mastication   . Skin cyst   . Unspecified urinary incontinence     Past Surgical History:  Procedure Laterality Date  . LOBECTOMY Right 04/15/2015   RUL lobectomy    Family History  Problem Relation Age of Onset  . Breast cancer Mother     Social History:  reports that he has been smoking cigarettes.  He has a 17.00 pack-year smoking history. He has never used smokeless tobacco. He reports that he drinks about 3.6 oz of alcohol per week. He reports that he does not use drugs.  Allergies: No Known Allergies  Medications: I have reviewed the patient's current medications.  Results for orders placed or performed during the hospital encounter of 11/05/17 (from the past 48 hour(s))  CBC with Differential     Status: Abnormal   Collection Time: 11/05/17  4:40 PM  Result Value Ref Range   WBC 7.7 4.0 - 10.5 K/uL   RBC 3.88 (L) 4.22 - 5.81 MIL/uL   Hemoglobin 11.2 (L) 13.0 - 17.0 g/dL   HCT 34.8 (L) 39.0 - 52.0 %   MCV 89.7 78.0 - 100.0 fL   MCH 28.9 26.0 - 34.0 pg   MCHC 32.2 30.0 - 36.0 g/dL   RDW 19.0 (H) 11.5 - 15.5 %   Platelets 180 150 - 400 K/uL   Neutrophils Relative % 84 %   Neutro Abs 6.4 1.7 - 7.7 K/uL   Lymphocytes Relative 11 %   Lymphs Abs 0.9 0.7 - 4.0 K/uL   Monocytes Relative 5 %   Monocytes Absolute 0.4 0.1 - 1.0 K/uL   Eosinophils Relative 0 %   Eosinophils Absolute 0.0 0.0 - 0.7 K/uL   Basophils Relative 0 %   Basophils Absolute 0.0 0.0 - 0.1 K/uL    Comment: Performed at [facility], [place] [address] [place], [place] [address]  Comprehensive metabolic panel     Status: Abnormal   Collection Time: 11/05/17  4:40 PM  Result Value Ref Range   Sodium 144 135 - 145 mmol/L   Potassium 3.6 3.5 - 5.1 mmol/L   Chloride 106 101 - 111 mmol/L   CO2 27 22 - 32  mmol/L   Glucose, Bld 116 (H) 65 - 99 mg/dL   BUN 14 6 - 20 mg/dL   Creatinine, Ser 0.75 0.61 - 1.24 mg/dL   Calcium 9.2 8.9 - 10.3 mg/dL   Total Protein 7.3 6.5 - 8.1 g/dL   Albumin 3.9 3.5 - 5.0 g/dL   AST 19 15 - 41 U/L   ALT 14 (L) 17 - 63 U/L   Alkaline Phosphatase 52 38 - 126 U/L   Total Bilirubin 0.8 0.3 - 1.2 mg/dL   GFR calc non Af Amer >60 >60 mL/min   GFR calc Af Amer >60 >60 mL/min    Comment: (NOTE) The eGFR has been calculated using the CKD EPI equation. This calculation has not been validated in all clinical situations. eGFR's persistently <60 mL/min signify possible Chronic Kidney Disease.    Anion gap 11 5 - 15    Comment: Performed at [facility], [place] [address], [place], [place] [address]    Dg Chest 2 View  Result Date: 11/05/2017 CLINICAL DATA:  Weakness.  Productive cough for 2 days. EXAM: CHEST - 2 VIEW COMPARISON:  09/04/2005. FINDINGS: Cardiac silhouette is  normal in size. No mediastinal or hilar masses. There are surgical vascular clips projecting over the right hilum, new since the prior chest radiographs, stable from a PET-CT dated 12/01/2016. Lungs are hyperexpanded but clear. No pleural effusion or pneumothorax. Skeletal structures are demineralized. IMPRESSION: No acute cardiopulmonary disease. Hyperexpanded lungs consistent with COPD. Electronically Signed   By: Lajean Manes M.D.   On: 11/05/2017 17:30   Dg Femur Min 2 Views Left  Result Date: 11/05/2017 CLINICAL DATA:  Fall.  Left thigh pain. EXAM: LEFT FEMUR 2 VIEWS COMPARISON:  No recent prior. FINDINGS: Peripheral vascular calcification. Degenerative change left hip and knee. Displaced left femoral neck fracture is present. IMPRESSION: 1.  Displaced left femoral neck fracture. 2.  Degenerative changes left hip and knee. 3.  Peripheral vascular disease. Electronically Signed   By: Marcello Moores  Register   On: 11/05/2017 15:13    Review of Systems  All other systems reviewed and are negative.  Blood pressure (!) 148/87, pulse (!) 107, temperature 98.2 F (36.8 C), temperature source Oral, resp. rate 16, SpO2 97 %. Physical Exam  Constitutional: He is oriented to person, place, and time. He appears well-developed and well-nourished.  HENT:  Head: Normocephalic and atraumatic.  Eyes: Pupils are equal, round, and reactive to light. EOM are normal.  Neck: Normal range of motion. Neck supple.  Cardiovascular: Normal rate and regular rhythm.  Respiratory: Effort normal and breath sounds normal.  GI: Soft. Bowel sounds are normal.  Musculoskeletal:       Left hip: He exhibits decreased range of motion, decreased strength, tenderness and bony tenderness.  Neurological: He is alert and oriented to person, place, and time.  Skin: Skin is warm.  Psychiatric: He has a normal mood and affect.    Assessment/Plan: Left hip displaced femoral neck fracture  I have spoken to the patient in length and  he understands fully his situation and my recommendation for surgery.  The plan will be to proceed to surgery tomorrow morning 4/18 for fixation of his left hip.  The risks and benefits have been explained in detail.  Mcarthur Rossetti 11/05/2017, 8:13 PM

## 2017-11-05 NOTE — H&P (Signed)
History and Physical    Andre Silva ZDG:[id number] DOB: [birth year] DOA: 11/05/2017  PCP: [facility]  Patient coming from: Home  I have personally briefly reviewed patient's old medical records in [place]  Chief Complaint: Fall, hip pain  HPI: Andre Silva is a 83 y.o. male with medical history significant of NSCLC s/p RUL lobectomy in 2016.  79mm irregular nodule in LLL suspicious for recurrence in Aug 2018.  Lost to follow up.  Patient had mechanical fall today.  No LOC with fall.  Severe L hip pain with movement, inability to ambulate post fall.  Denies CP or SOB.   ED Course: Surgery 0730 tomorrow AM.   Review of Systems: As per HPI otherwise 10 point review of systems negative.   Past Medical History:  Diagnosis Date  . Absent sense of smell   . Corns and callosities   . Hypertension   . Intermittent cerebral ischemia   . Knee joint pain   . Low back pain   . Lung cancer ([place])   . Problems with swallowing and mastication   . Skin cyst   . Unspecified urinary incontinence     Past Surgical History:  Procedure Laterality Date  . LOBECTOMY Right 04/15/2015   RUL lobectomy     reports that he has been smoking cigarettes.  He has a 17.00 pack-year smoking history. He has never used smokeless tobacco. He reports that he drinks about 3.6 oz of alcohol per week. He reports that he does not use drugs.  No Known Allergies  Family History  Problem Relation Age of Onset  . Breast cancer Mother      Prior to Admission medications   Medication Sig Start Date End Date Taking? Authorizing Provider  amLODipine (NORVASC) 10 MG tablet Take 10 mg by mouth daily.   Yes [provider]  amLODipine (NORVASC) 5 MG tablet Take 1 tablet (5 mg total) by mouth daily. 09/18/13  Yes Kelvin Cellar, MD  aspirin 325 MG tablet Take 1 tablet (325 mg total) by mouth daily. 09/18/13  Yes Kelvin Cellar, MD  atorvastatin (LIPITOR) 40 MG tablet Take  40 mg by mouth at bedtime.   Yes [provider]  cetirizine (ZYRTEC) 10 MG tablet Take 10 mg by mouth daily.   Yes [provider]  metoprolol tartrate (LOPRESSOR) 50 MG tablet Take 25 mg by mouth 2 (two) times daily.   Yes [provider]  Omega-3 Fatty Acids (FISH OIL PO) Take 1 capsule by mouth daily.    Yes [provider]  pantoprazole (PROTONIX) 40 MG tablet Take 40 mg by mouth 2 (two) times daily.   Yes [provider]  ranitidine (ZANTAC) 300 MG tablet Take 300 mg by mouth at bedtime.   Yes [provider]  simvastatin (ZOCOR) 20 MG tablet Take 1 tablet (20 mg total) by mouth daily. 10/11/13  Yes Cameron Sprang, MD  tamsulosin (FLOMAX) 0.4 MG CAPS capsule Take 0.4 mg by mouth daily.   Yes [provider]  zinc gluconate 50 MG tablet Take 50 mg by mouth daily.   Yes [provider]    Physical Exam: Vitals:   11/05/17 1700 11/05/17 1919 11/05/17 1930 11/05/17 2000  BP: (!) 142/82 (!) 151/91 (!) 148/87 140/80  Pulse: 99 (!) 110 (!) 107 (!) 107  Resp: 18 16    Temp:  98.2 F (36.8 C)    TempSrc:  Oral    SpO2: 99% 94%  97% 98%    Constitutional: NAD, calm, comfortable, cachectic, patient denies recent weight loss though. Eyes: PERRL, lids and conjunctivae normal ENMT: Mucous membranes are moist. Posterior pharynx clear of any exudate or lesions.Normal dentition. Speech impediment which patient states is baseline. Neck: normal, supple, no masses, no thyromegaly Respiratory: clear to auscultation bilaterally, no wheezing, no crackles. Normal respiratory effort. No accessory muscle use.  Cardiovascular: Regular rate and rhythm, no murmurs / rubs / gallops. No extremity edema. 2+ pedal pulses. No carotid bruits.  Abdomen: no tenderness, no masses palpated. No hepatosplenomegaly. Bowel sounds positive.  Musculoskeletal: no clubbing / cyanosis. No joint deformity upper and lower extremities. Good ROM, no  contractures. Normal muscle tone.  Skin: no rashes, lesions, ulcers. No induration Neurologic: CN 2-12 grossly intact. Sensation intact, DTR normal. Strength 5/5 in all 4.  Psychiatric: Normal judgment and insight. Alert and oriented x 3. Normal mood.    Labs on Admission: I have personally reviewed following labs and imaging studies  CBC: Recent Labs  Lab 11/05/17 1640  WBC 7.7  NEUTROABS 6.4  HGB 11.2*  HCT 34.8*  MCV 89.7  PLT 272   Basic Metabolic Panel: Recent Labs  Lab 11/05/17 1640  NA 144  K 3.6  CL 106  CO2 27  GLUCOSE 116*  BUN 14  CREATININE 0.75  CALCIUM 9.2   GFR: CrCl cannot be calculated (Unknown ideal weight.). Liver Function Tests: Recent Labs  Lab 11/05/17 1640  AST 19  ALT 14*  ALKPHOS 52  BILITOT 0.8  PROT 7.3  ALBUMIN 3.9   No results for input(s): LIPASE, AMYLASE in the last 168 hours. No results for input(s): AMMONIA in the last 168 hours. Coagulation Profile: No results for input(s): INR, PROTIME in the last 168 hours. Cardiac Enzymes: No results for input(s): CKTOTAL, CKMB, CKMBINDEX, TROPONINI in the last 168 hours. BNP (last 3 results) No results for input(s): PROBNP in the last 8760 hours. HbA1C: No results for input(s): HGBA1C in the last 72 hours. CBG: No results for input(s): GLUCAP in the last 168 hours. Lipid Profile: No results for input(s): CHOL, HDL, LDLCALC, TRIG, CHOLHDL, LDLDIRECT in the last 72 hours. Thyroid Function Tests: No results for input(s): TSH, T4TOTAL, FREET4, T3FREE, THYROIDAB in the last 72 hours. Anemia Panel: No results for input(s): VITAMINB12, FOLATE, FERRITIN, TIBC, IRON, RETICCTPCT in the last 72 hours. Urine analysis:    Component Value Date/Time   COLORURINE YELLOW 09/17/2013 1458   APPEARANCEUR CLEAR 09/17/2013 1458   LABSPEC 1.017 09/17/2013 1458   PHURINE 7.5 09/17/2013 1458   GLUCOSEU NEGATIVE 09/17/2013 1458   HGBUR NEGATIVE 09/17/2013 1458   BILIRUBINUR NEGATIVE 09/17/2013 1458     KETONESUR NEGATIVE 09/17/2013 1458   PROTEINUR NEGATIVE 09/17/2013 1458   UROBILINOGEN 1.0 09/17/2013 1458   NITRITE NEGATIVE 09/17/2013 1458   LEUKOCYTESUR NEGATIVE 09/17/2013 1458    Radiological Exams on Admission: Dg Chest 2 View  Result Date: 11/05/2017 CLINICAL DATA:  Weakness.  Productive cough for 2 days. EXAM: CHEST - 2 VIEW COMPARISON:  09/04/2005. FINDINGS: Cardiac silhouette is normal in size. No mediastinal or hilar masses. There are surgical vascular clips projecting over the right hilum, new since the prior chest radiographs, stable from a PET-CT dated 12/01/2016. Lungs are hyperexpanded but clear. No pleural effusion or pneumothorax. Skeletal structures are demineralized. IMPRESSION: No acute cardiopulmonary disease. Hyperexpanded lungs consistent with COPD. Electronically Signed   By: Lajean Manes M.D.   On: 11/05/2017 17:30   Dg Femur Min 2 Views  Left  Result Date: 11/05/2017 CLINICAL DATA:  Fall.  Left thigh pain. EXAM: LEFT FEMUR 2 VIEWS COMPARISON:  No recent prior. FINDINGS: Peripheral vascular calcification. Degenerative change left hip and knee. Displaced left femoral neck fracture is present. IMPRESSION: 1.  Displaced left femoral neck fracture. 2.  Degenerative changes left hip and knee. 3.  Peripheral vascular disease. Electronically Signed   By: Marcello Moores  Register   On: 11/05/2017 15:13    EKG: Independently reviewed.  Assessment/Plan Principal Problem:   Closed left hip fracture, initial encounter ([place]) Active Problems:   HTN (hypertension)   Primary cancer of right upper lobe of lung (HCC)   Presumed primary cancer of left lower lobe of lung (HCC)   HLD (hyperlipidemia)    1. L hip fx - 1. Hip fx pathway 2. Pain control per pathway 3. NPO after MN 4. Surgery tomorrow AM 2. Concern for LLL Lung CA - 1. 39mm irregular nodule on CT 02/17/17 that was suspicious. 2. Patient was apparently lost to follow up! 3. No weight loss per patient since then nor  symptoms 4. Will get CT chest w/o contrast to re-evaluate nodule. 5. Call oncology / rad onc / CVTS in AM depending on what CT shows. 3. HTN - 1. Cont home meds 2. Will use lower dose of amlodipine since he is unsure which one he takes 4. H/o RUL NSCLC - s/p RUL lobectomy in 2016 5. HLD - continue statin  DVT prophylaxis: SCDs, chemo ppx after surgery Code Status: Full Family Communication: No family in room Disposition Plan: Home after admit Consults called: Dr. Ninfa [facility] Admission status: Admit to inpatient   [place], [place] [facility] Pager [phone]  If 7AM-7PM, please contact day team taking care of patient [email or web address] Password [facility]  11/05/2017, 8:33 PM

## 2017-11-05 NOTE — ED Provider Notes (Signed)
Emergency Department Provider Note   I have reviewed the triage vital signs and the nursing notes.   HISTORY  Chief Complaint Leg Pain (L thigh)   HPI Andre Silva is a 83 y.o. male who had mechanical fall at home with subsequent left hip pain was able to ambulate on it but with severe pain so presented here for evaluation.  Patient did not hit his head.  Does not have pain in his arms, chest, back or abdomen. No other associated or modifying symptoms.    Past Medical History:  Diagnosis Date  . Absent sense of smell   . Corns and callosities   . Hypertension   . Intermittent cerebral ischemia   . Knee joint pain   . Low back pain   . Lung cancer ([place])   . Problems with swallowing and mastication   . Skin cyst   . Unspecified urinary incontinence     Patient Active Problem List   Diagnosis Date Noted  . Closed left hip fracture, initial encounter ([place]) 11/05/2017  . HLD (hyperlipidemia) 11/05/2017  . Presumed primary cancer of left lower lobe of lung ([place]) 10/15/2016  . Primary cancer of right upper lobe of lung ([place]) 04/05/2015  . HTN (hypertension) 09/18/2013  . Syncope and collapse 09/18/2013  . TIA (transient ischemic attack) 09/17/2013    Past Surgical History:  Procedure Laterality Date  . LOBECTOMY Right 04/15/2015   RUL lobectomy      Allergies Patient has no known allergies.  Family History  Problem Relation Age of Onset  . Breast cancer Mother     Social History Social History   Tobacco Use  . Smoking status: Current Every Day Smoker    Packs/day: 0.25    Years: 68.00    Pack years: 17.00    Types: Cigarettes  . Smokeless tobacco: Never Used  Substance Use Topics  . Alcohol use: Yes    Alcohol/week: 3.6 oz    Types: 6 Cans of beer per week    Comment: Six pack of beer per week  . Drug use: No    Review of Systems  All other systems negative except as documented in the HPI. All pertinent positives and negatives as reviewed  in the HPI. ____________________________________________   PHYSICAL EXAM:  VITAL SIGNS: ED Triage Vitals  Enc Vitals Group     BP 11/05/17 1429 118/83     Pulse Rate 11/05/17 1429 89     Resp 11/05/17 1429 16     Temp 11/05/17 1429 98.2 F (36.8 C)     Temp Source 11/05/17 1919 Oral     SpO2 11/05/17 1423 96 %    Constitutional: Alert and oriented. Well appearing and in no acute distress. Eyes: Conjunctivae are normal. PERRL. EOMI. Head: Atraumatic. Nose: No congestion/rhinnorhea. Mouth/Throat: Mucous membranes are moist.  Oropharynx non-erythematous. Neck: No stridor.  No meningeal signs.   Cardiovascular: Normal rate, regular rhythm. Good peripheral circulation. Grossly normal heart sounds.   Respiratory: Normal respiratory effort.  No retractions. Lungs CTAB. Gastrointestinal: Soft and nontender. No distention.  Musculoskeletal: TTP over left pelvis. No gross deformities of extremities. Neurologic:  Normal speech and language. No gross focal neurologic deficits are appreciated.  Skin:  Skin is warm, dry and intact. No rash noted.  ____________________________________________   LABS (all labs ordered are listed, but only abnormal results are displayed)  Labs Reviewed  CBC WITH DIFFERENTIAL/PLATELET - Abnormal; Notable for the following components:      Result  Value   RBC 3.88 (*)    Hemoglobin 11.2 (*)    HCT 34.8 (*)    RDW 19.0 (*)    All other components within normal limits  COMPREHENSIVE METABOLIC PANEL - Abnormal; Notable for the following components:   Glucose, Bld 116 (*)    ALT 14 (*)    All other components within normal limits  TYPE AND SCREEN  ABO/RH   ____________________________________________  EKG   EKG Interpretation  Date/Time:  Friday Nov 05 2017 16:37:41 EDT Ventricular Rate:  95 PR Interval:    QRS Duration: 107 QT Interval:  356 QTC Calculation: 448 R Axis:   -51 Text Interpretation:  Sinus rhythm LAD, consider left anterior  fascicular block Abnormal R-wave progression, early transition Nonspecific T abnormalities, lateral leads suspect likely changes c/w LVH Confirmed by Merrily Pew [phone]) on 11/05/2017 7:34:05 PM       ____________________________________________  RADIOLOGY  Dg Chest 2 View  Result Date: 11/05/2017 CLINICAL DATA:  Weakness.  Productive cough for 2 days. EXAM: CHEST - 2 VIEW COMPARISON:  09/04/2005. FINDINGS: Cardiac silhouette is normal in size. No mediastinal or hilar masses. There are surgical vascular clips projecting over the right hilum, new since the prior chest radiographs, stable from a PET-CT dated 12/01/2016. Lungs are hyperexpanded but clear. No pleural effusion or pneumothorax. Skeletal structures are demineralized. IMPRESSION: No acute cardiopulmonary disease. Hyperexpanded lungs consistent with COPD. Electronically Signed   By: Lajean Manes M.D.   On: 11/05/2017 17:30   Ct Chest Wo Contrast  Result Date: 11/05/2017 CLINICAL DATA:  83 year old male with history of right upper lobectomy for lung cancer demonstrating 2 synchronous primary bronchogenic carcinoma is, one adenocarcinoma and one squamous cell carcinoma with left lower lobe pulmonary nodule seen on PET-CT dated 12/01/2016. EXAM: CT CHEST WITHOUT CONTRAST TECHNIQUE: Multidetector CT imaging of the chest was performed following the standard protocol without IV contrast. COMPARISON:  12/01/2016 PET-CT FINDINGS: Cardiovascular: Heart size is normal. Left main and three-vessel coronary arteriosclerosis. No significant pericardial effusion or pleural thickening. Nonaneurysmal atherosclerotic aorta. Unremarkable noncontrast appearance of the pulmonary vasculature. Mediastinum/Nodes: No pathologically enlarged lymph nodes within the mediastinum. Assessment for hilar lymphadenopathy is limited due to lack of IV contrast. Patent trachea and mainstem bronchi. Unremarkable esophagus. Lungs/Pleura: Larger spiculated mass in the left lower  lobe now measuring 14 x 15 x 11 mm versus 7 mm previously. Additional 5 x 2 x 7 mm spiculation seen in the left lower lobe. No effusion or pneumothorax. Paraseptal emphysema at the left lung apex. Right upper lobectomy postop change. Upper Abdomen: No acute abnormality. Musculoskeletal: No aggressive osseous lesions. IMPRESSION: 1. Interval increase in size of left lower lobe pulmonary spiculated lesion now measuring 14 x 15 x 11 mm versus 7 mm previously. Additional 5 x 2 x 7 mm spiculated focus in the left lower lobe abutting the diaphragmatic pleura. 2. Resolution of subpleural inflammatory change in the right lower lobe since prior PET-CT. 3. Aortic atherosclerosis with left main and three-vessel coronary arteriosclerosis. Aortic Atherosclerosis (ICD10-I70.0) and Emphysema (ICD10-J43.9). Electronically Signed   By: Ashley Royalty M.D.   On: 11/05/2017 21:17   Dg Femur Min 2 Views Left  Result Date: 11/05/2017 CLINICAL DATA:  Fall.  Left thigh pain. EXAM: LEFT FEMUR 2 VIEWS COMPARISON:  No recent prior. FINDINGS: Peripheral vascular calcification. Degenerative change left hip and knee. Displaced left femoral neck fracture is present. IMPRESSION: 1.  Displaced left femoral neck fracture. 2.  Degenerative changes left hip and knee.  3.  Peripheral vascular disease. Electronically Signed   By: Marcello Moores  Register   On: 11/05/2017 15:13    ____________________________________________   PROCEDURES  Procedure(s) performed:   Procedures   ____________________________________________   INITIAL IMPRESSION / ASSESSMENT AND PLAN / ED COURSE  After multiple pages, discussed fracture with Dr. Rush Farmer who recommends admission here, NPO until they see him. Will consult hospitalist.   hospitalist to admit.     Pertinent labs & imaging results that were available during my care of the patient were reviewed by me and considered in my medical decision making (see chart for  details).  ____________________________________________  FINAL CLINICAL IMPRESSION(S) / ED DIAGNOSES  Final diagnoses:  Closed displaced fracture of left femoral neck ([place])     MEDICATIONS GIVEN DURING THIS VISIT:  Medications  HYDROcodone-acetaminophen (NORCO/VICODIN) 5-325 MG per tablet 1-2 tablet (1 tablet Oral Given 11/05/17 2203)  morphine 2 MG/ML injection 0.5 mg (has no administration in time range)  chlorhexidine (HIBICLENS) 4 % liquid 4 application (has no administration in time range)  metoprolol tartrate (LOPRESSOR) tablet 25 mg (25 mg Oral Given 11/05/17 2203)  pantoprazole (PROTONIX) EC tablet 40 mg (40 mg Oral Given 11/05/17 2203)  famotidine (PEPCID) tablet 40 mg (40 mg Oral Given 11/05/17 2208)  atorvastatin (LIPITOR) tablet 40 mg (40 mg Oral Given 11/05/17 2203)  amLODipine (NORVASC) tablet 5 mg (has no administration in time range)  tamsulosin (FLOMAX) capsule 0.4 mg (has no administration in time range)     NEW OUTPATIENT MEDICATIONS STARTED DURING THIS VISIT:  Current Discharge Medication List      Note:  This note was prepared with assistance of Dragon voice recognition software. Occasional wrong-word or sound-a-like substitutions may have occurred due to the inherent limitations of voice recognition software.   Merrily Pew, MD 11/05/17 [phone]

## 2017-11-06 ENCOUNTER — Inpatient Hospital Stay (HOSPITAL_COMMUNITY): Payer: Medicare PPO

## 2017-11-06 ENCOUNTER — Inpatient Hospital Stay (HOSPITAL_COMMUNITY): Payer: Medicare PPO | Admitting: Certified Registered"

## 2017-11-06 ENCOUNTER — Encounter (HOSPITAL_COMMUNITY): Payer: Self-pay | Admitting: Certified Registered"

## 2017-11-06 ENCOUNTER — Encounter (HOSPITAL_COMMUNITY)
Admission: EM | Disposition: A | Discharge status: Skilled Nursing Facility | Payer: Self-pay | Source: Home / Self Care | Attending: Internal Medicine

## 2017-11-06 DIAGNOSIS — I10 Essential (primary) hypertension: Secondary | ICD-10-CM

## 2017-11-06 DIAGNOSIS — S72002A Fracture of unspecified part of neck of left femur, initial encounter for closed fracture: Principal | ICD-10-CM

## 2017-11-06 DIAGNOSIS — Z96642 Presence of left artificial hip joint: Secondary | ICD-10-CM

## 2017-11-06 DIAGNOSIS — E785 Hyperlipidemia, unspecified: Secondary | ICD-10-CM

## 2017-11-06 HISTORY — PX: TOTAL HIP ARTHROPLASTY: SHX124

## 2017-11-06 LAB — ABO/RH: ABO/RH(D): A POS

## 2017-11-06 LAB — SURGICAL PCR SCREEN
MRSA, PCR: NEGATIVE
Staphylococcus aureus: NEGATIVE

## 2017-11-06 SURGERY — ARTHROPLASTY, HIP, TOTAL, ANTERIOR APPROACH
Anesthesia: General | Site: Hip | Laterality: Left

## 2017-11-06 MED ORDER — STERILE WATER FOR IRRIGATION IR SOLN
Status: DC | PRN
  Administered 2017-11-06: 2000 mL

## 2017-11-06 MED ORDER — MIDAZOLAM HCL 2 MG/2ML IJ SOLN
INTRAMUSCULAR | Status: AC
  Filled 2017-11-06: qty 2

## 2017-11-06 MED ORDER — PHENYLEPHRINE 40 MCG/ML (10ML) SYRINGE FOR IV PUSH (FOR BLOOD PRESSURE SUPPORT)
PREFILLED_SYRINGE | INTRAVENOUS | Status: DC | PRN
  Administered 2017-11-06: 160 ug via INTRAVENOUS

## 2017-11-06 MED ORDER — SUGAMMADEX SODIUM 200 MG/2ML IV SOLN
INTRAVENOUS | Status: DC | PRN
  Administered 2017-11-06: 150 mg via INTRAVENOUS

## 2017-11-06 MED ORDER — ASPIRIN EC 325 MG PO TBEC
325.0000 mg | DELAYED_RELEASE_TABLET | Freq: Every day | ORAL | Status: DC
  Administered 2017-11-07: 325 mg via ORAL
  Filled 2017-11-06: qty 1

## 2017-11-06 MED ORDER — MENTHOL 3 MG MT LOZG: 1.0000 | LOZENGE | OROMUCOSAL | Status: DC | PRN

## 2017-11-06 MED ORDER — SODIUM CHLORIDE 0.9 % IR SOLN
Status: DC | PRN
  Administered 2017-11-06: 1000 mL

## 2017-11-06 MED ORDER — HYDROCODONE-ACETAMINOPHEN 5-325 MG PO TABS
1.0000 | ORAL_TABLET | ORAL | Status: DC | PRN
  Administered 2017-11-06 – 2017-11-07 (×2): 2 via ORAL
  Filled 2017-11-06 (×2): qty 2

## 2017-11-06 MED ORDER — PHENYLEPHRINE HCL-NACL 10-0.9 MG/250ML-% IV SOLN
INTRAVENOUS | Status: AC
  Filled 2017-11-06: qty 250

## 2017-11-06 MED ORDER — CEFAZOLIN SODIUM-DEXTROSE 2-4 GM/100ML-% IV SOLN
2.0000 g | Freq: Four times a day (QID) | INTRAVENOUS | Status: AC
  Administered 2017-11-06 (×2): 2 g via INTRAVENOUS
  Filled 2017-11-06 (×2): qty 100

## 2017-11-06 MED ORDER — MORPHINE SULFATE (PF) 4 MG/ML IV SOLN: 0.5000 mg | INTRAVENOUS | Status: DC | PRN

## 2017-11-06 MED ORDER — ONDANSETRON HCL 4 MG PO TABS: 4.0000 mg | ORAL_TABLET | Freq: Four times a day (QID) | ORAL | Status: DC | PRN

## 2017-11-06 MED ORDER — PROPOFOL 10 MG/ML IV BOLUS
INTRAVENOUS | Status: AC
  Filled 2017-11-06: qty 20

## 2017-11-06 MED ORDER — HYDROCODONE-ACETAMINOPHEN 7.5-325 MG PO TABS: 1.0000 | ORAL_TABLET | ORAL | Status: DC | PRN

## 2017-11-06 MED ORDER — METOCLOPRAMIDE HCL 5 MG/ML IJ SOLN: 5.0000 mg | Freq: Three times a day (TID) | INTRAMUSCULAR | Status: DC | PRN

## 2017-11-06 MED ORDER — ACETAMINOPHEN 325 MG PO TABS: 325.0000 mg | ORAL_TABLET | Freq: Four times a day (QID) | ORAL | Status: DC | PRN

## 2017-11-06 MED ORDER — ONDANSETRON HCL 4 MG/2ML IJ SOLN: 4.0000 mg | Freq: Four times a day (QID) | INTRAMUSCULAR | Status: DC | PRN

## 2017-11-06 MED ORDER — POVIDONE-IODINE 10 % EX SWAB: 2.0000 "application " | Freq: Once | CUTANEOUS | Status: DC

## 2017-11-06 MED ORDER — FENTANYL CITRATE (PF) 100 MCG/2ML IJ SOLN
INTRAMUSCULAR | Status: AC
  Administered 2017-11-06: 50 ug
  Filled 2017-11-06: qty 2

## 2017-11-06 MED ORDER — ROCURONIUM BROMIDE 10 MG/ML (PF) SYRINGE
PREFILLED_SYRINGE | INTRAVENOUS | Status: DC | PRN
  Administered 2017-11-06: 40 mg via INTRAVENOUS

## 2017-11-06 MED ORDER — PROMETHAZINE HCL 25 MG/ML IJ SOLN: 6.2500 mg | INTRAMUSCULAR | Status: DC | PRN

## 2017-11-06 MED ORDER — PANTOPRAZOLE SODIUM 40 MG PO TBEC: 40.0000 mg | DELAYED_RELEASE_TABLET | Freq: Every day | ORAL | Status: DC

## 2017-11-06 MED ORDER — ALUM & MAG HYDROXIDE-SIMETH 200-200-20 MG/5ML PO SUSP: 30.0000 mL | ORAL | Status: DC | PRN

## 2017-11-06 MED ORDER — DEXTROSE 5 % IV SOLN
INTRAVENOUS | Status: DC | PRN
  Administered 2017-11-06: 75 ug/min via INTRAVENOUS
  Administered 2017-11-06: 50 ug/min via INTRAVENOUS

## 2017-11-06 MED ORDER — CEFAZOLIN SODIUM-DEXTROSE 2-4 GM/100ML-% IV SOLN: 2.0000 g | INTRAVENOUS | Status: DC

## 2017-11-06 MED ORDER — CEFAZOLIN SODIUM-DEXTROSE 2-4 GM/100ML-% IV SOLN
INTRAVENOUS | Status: AC
  Filled 2017-11-06: qty 100

## 2017-11-06 MED ORDER — PHENOL 1.4 % MT LIQD
1.0000 | OROMUCOSAL | Status: DC | PRN
  Filled 2017-11-06: qty 177

## 2017-11-06 MED ORDER — PROPOFOL 10 MG/ML IV BOLUS
INTRAVENOUS | Status: DC | PRN
  Administered 2017-11-06: 100 mg via INTRAVENOUS

## 2017-11-06 MED ORDER — 0.9 % SODIUM CHLORIDE (POUR BTL) OPTIME
TOPICAL | Status: DC | PRN
  Administered 2017-11-06: 1000 mL

## 2017-11-06 MED ORDER — FENTANYL CITRATE (PF) 250 MCG/5ML IJ SOLN
INTRAMUSCULAR | Status: DC | PRN
  Administered 2017-11-06: 50 ug via INTRAVENOUS
  Administered 2017-11-06: 25 ug via INTRAVENOUS
  Administered 2017-11-06 (×3): 50 ug via INTRAVENOUS
  Administered 2017-11-06: 25 ug via INTRAVENOUS

## 2017-11-06 MED ORDER — FENTANYL CITRATE (PF) 250 MCG/5ML IJ SOLN
INTRAMUSCULAR | Status: AC
  Filled 2017-11-06: qty 5

## 2017-11-06 MED ORDER — POLYETHYLENE GLYCOL 3350 17 G PO PACK: 17.0000 g | PACK | Freq: Every day | ORAL | Status: DC | PRN

## 2017-11-06 MED ORDER — ALBUMIN HUMAN 5 % IV SOLN
INTRAVENOUS | Status: DC | PRN
  Administered 2017-11-06: 08:00:00 via INTRAVENOUS

## 2017-11-06 MED ORDER — METHOCARBAMOL 500 MG PO TABS: 500.0000 mg | ORAL_TABLET | Freq: Four times a day (QID) | ORAL | Status: DC | PRN

## 2017-11-06 MED ORDER — DOCUSATE SODIUM 100 MG PO CAPS
100.0000 mg | ORAL_CAPSULE | Freq: Two times a day (BID) | ORAL | Status: DC
  Administered 2017-11-06 – 2017-11-07 (×2): 100 mg via ORAL
  Filled 2017-11-06 (×2): qty 1

## 2017-11-06 MED ORDER — LACTATED RINGERS IV SOLN: INTRAVENOUS | Status: DC

## 2017-11-06 MED ORDER — DEXTROSE 5 % IV SOLN
500.0000 mg | Freq: Four times a day (QID) | INTRAVENOUS | Status: DC | PRN
  Administered 2017-11-06 (×2): 500 mg via INTRAVENOUS
  Filled 2017-11-06 (×2): qty 550

## 2017-11-06 MED ORDER — MEPERIDINE HCL 50 MG/ML IJ SOLN: 6.2500 mg | INTRAMUSCULAR | Status: DC | PRN

## 2017-11-06 MED ORDER — FENTANYL CITRATE (PF) 100 MCG/2ML IJ SOLN
25.0000 ug | INTRAMUSCULAR | Status: DC | PRN
  Administered 2017-11-06 (×2): 50 ug via INTRAVENOUS

## 2017-11-06 MED ORDER — CEFAZOLIN SODIUM-DEXTROSE 2-3 GM-%(50ML) IV SOLR
INTRAVENOUS | Status: DC | PRN
  Administered 2017-11-06: 2 g via INTRAVENOUS

## 2017-11-06 MED ORDER — LIDOCAINE 2% (20 MG/ML) 5 ML SYRINGE
INTRAMUSCULAR | Status: DC | PRN
  Administered 2017-11-06: 40 mg via INTRAVENOUS

## 2017-11-06 MED ORDER — SUCCINYLCHOLINE CHLORIDE 200 MG/10ML IV SOSY
PREFILLED_SYRINGE | INTRAVENOUS | Status: DC | PRN
  Administered 2017-11-06: 100 mg via INTRAVENOUS

## 2017-11-06 MED ORDER — LACTATED RINGERS IV SOLN
INTRAVENOUS | Status: DC | PRN
  Administered 2017-11-06 (×2): via INTRAVENOUS

## 2017-11-06 MED ORDER — ONDANSETRON HCL 4 MG/2ML IJ SOLN
INTRAMUSCULAR | Status: DC | PRN
  Administered 2017-11-06: 4 mg via INTRAVENOUS

## 2017-11-06 MED ORDER — METOCLOPRAMIDE HCL 5 MG PO TABS: 5.0000 mg | ORAL_TABLET | Freq: Three times a day (TID) | ORAL | Status: DC | PRN

## 2017-11-06 SURGICAL SUPPLY — 33 items
APL SKNCLS STERI-STRIP NONHPOA (GAUZE/BANDAGES/DRESSINGS)
BAG SPEC THK2 15X12 ZIP CLS (MISCELLANEOUS) ×1
BAG ZIPLOCK 12X15 (MISCELLANEOUS) ×1 IMPLANT
BENZOIN TINCTURE PRP APPL 2/3 (GAUZE/BANDAGES/DRESSINGS) IMPLANT
BLADE SAW SGTL 18X1.27X75 (BLADE) ×2 IMPLANT
CAPT HIP TOTAL 2 ×1 IMPLANT
COVER PERINEAL POST (MISCELLANEOUS) ×2 IMPLANT
COVER SURGICAL LIGHT HANDLE (MISCELLANEOUS) ×2 IMPLANT
DRAPE STERI IOBAN 125X83 (DRAPES) ×2 IMPLANT
DRAPE U-SHAPE 47X51 STRL (DRAPES) ×4 IMPLANT
DRSG AQUACEL AG ADV 3.5X10 (GAUZE/BANDAGES/DRESSINGS) ×2 IMPLANT
DURAPREP 26ML APPLICATOR (WOUND CARE) ×2 IMPLANT
ELECT REM PT RETURN 15FT ADLT (MISCELLANEOUS) ×2 IMPLANT
GAUZE XEROFORM 1X8 LF (GAUZE/BANDAGES/DRESSINGS) ×1 IMPLANT
GLOVE BIO SURGEON STRL SZ7.5 (GLOVE) ×2 IMPLANT
GLOVE BIOGEL PI IND STRL 8 (GLOVE) ×2 IMPLANT
GLOVE BIOGEL PI INDICATOR 8 (GLOVE) ×2
GLOVE ECLIPSE 8.0 STRL XLNG CF (GLOVE) ×2 IMPLANT
GOWN STRL REUS W/TWL XL LVL3 (GOWN DISPOSABLE) ×4 IMPLANT
HANDPIECE INTERPULSE COAX TIP (DISPOSABLE) ×2
HOLDER FOLEY CATH W/STRAP (MISCELLANEOUS) ×2 IMPLANT
PACK ANTERIOR HIP CUSTOM (KITS) ×2 IMPLANT
SET HNDPC FAN SPRY TIP SCT (DISPOSABLE) ×1 IMPLANT
STAPLER VISISTAT 35W (STAPLE) ×1 IMPLANT
STRIP CLOSURE SKIN 1/2X4 (GAUZE/BANDAGES/DRESSINGS) IMPLANT
SUT ETHIBOND NAB CT1 #1 30IN (SUTURE) ×2 IMPLANT
SUT MNCRL AB 4-0 PS2 18 (SUTURE) ×1 IMPLANT
SUT VIC AB 0 CT1 36 (SUTURE) ×2 IMPLANT
SUT VIC AB 1 CT1 36 (SUTURE) ×2 IMPLANT
SUT VIC AB 2-0 CT1 27 (SUTURE) ×4
SUT VIC AB 2-0 CT1 TAPERPNT 27 (SUTURE) ×2 IMPLANT
TRAY FOLEY MTR SLVR 16FR STAT (SET/KITS/TRAYS/PACK) ×2 IMPLANT
YANKAUER SUCT BULB TIP 10FT TU (MISCELLANEOUS) ×2 IMPLANT

## 2017-11-06 NOTE — Care Management Note (Signed)
Case Management Note  Patient Details  Name: Andre Silva MRN: [id number] Date of Birth: [birth year]  Subjective/Objective:   Closed hip fracture with s/p Left THA                 Action/Plan: NCM will follow for dc needs. Notification sent to [facility] of admission and note assistance will be needed for SNF or HH. CSW referral for possible homelessness.   Expected Discharge Date:  (unknown)               Expected Discharge Plan:  Skilled Nursing Facility  In-House Referral:  Clinical Social Work  Discharge planning Services  CM Consult  Post Acute Care Choice:  NA Choice offered to:  NA  DME Arranged:    DME Agency:     HH Arranged:    [place] Agency:     Status of Service:  In process, will continue to follow  If discussed at Long Length of Stay Meetings, dates discussed:    Additional Comments:  Erenest Rasher, RN 11/06/2017, 11:25 AM

## 2017-11-06 NOTE — Progress Notes (Signed)
PROGRESS NOTE   Andre Silva  HER:[id number]    DOB: [birth year]    DOA: 11/05/2017  PCP: [facility]   I have briefly reviewed patients previous medical records in [facility].  Brief Narrative:  83 year old male with PMH of NSCLC RUL status post RUL lobectomy 2016, NSCLC left lower lobe last seen by radiation oncology on 10/26/2016 and appears to have been lost to follow-up, hoarseness/loss of olfactory and taste, HTN, HLD, sustained mechanical fall on 5/17 without LOC and presented with severe left hip pain and admitted for left hip fracture.  Orthopedics consulted and patient underwent left total hip arthroplasty anterior approach on 5/18.   Assessment & Plan:   Principal Problem:   Closed left hip fracture, initial encounter ([name]) Active Problems:   HTN (hypertension)   Primary cancer of right upper lobe of lung ([place])   Presumed primary cancer of left lower lobe of lung ([name])   HLD (hyperlipidemia)   Left femoral neck fracture: Orthopedics/Dr. Ninfa Linden consulted and patient underwent left total hip arthroplasty anterior approach on 5/18.  Management per orthopedics.  Lung cancer: History as indicated above.  PET scan 12/01/2016: Posterior LLL solid 7 mm pulmonary nodule with low-level metabolism, stable since 10/15/2016, suspicious for a metachronous primary bronchogenic carcinoma.  Cannot exclude nodal metastasis to subcarinal lymph node.  Admitting MD mentions 8 mm irregular nodule on CT 02/17/2017 that was suspicious but I am unable to find that report.  CT chest 5/17: Interval increase in size of LLL pulmonary spiculated lesion, seems to be have doubled in size.  Additional spiculated focus in left lower lobe.  Concern for recurrent lung cancer.  I discussed in detail with patient and patient's son at bedside regarding importance of follow-up and they verbalized understanding.  I discussed with Dr. Burr Medico, Oncology and sent an in basket message and she will follow-up &  have patient plugged back for outpatient follow-up.  Essential hypertension: Controlled.  Continue amlodipine and metoprolol.  Hyperlipidemia: Continue statins.  Normocytic anemia: Follow CBC in a.m.  Tobacco abuse: Cessation counseled.  Not on home oxygen.  COPD: Seen on imaging studies.  No clinical bronchospasm.  Monitor.  Aortic atherosclerosis/CAD: Seen on CT chest.  No chest pain.   DVT prophylaxis: SCDs and aspirin. Code Status: Full Family Communication: None at bedside Disposition: To be determined, likely will require SNF when medically ready   Consultants:  Orthopedics/Dr. Ninfa Linden  Procedures:  Left total hip arthroplasty anterior approach on 5/18  Antimicrobials:  None   Subjective: Patient with significant hoarseness and difficult to understand speech.  Son at bedside assisted with interpretation.  Reports appropriate postop left hip pain.  Patient lives with his girlfriend, ambulates without the help of a cane or a walker, apparently fell from his couch leading to injury and fracture.  Ongoing tobacco abuse.  Patient cannot recollect missing oncology appointments.  ROS: As above.  Objective:  Vitals:   11/06/17 1148 11/06/17 1245 11/06/17 1353 11/06/17 1450  BP: 121/79 122/78 131/78 138/85  Pulse: (!) 102 98 91 90  Resp: 18 12 14 14   Temp: 98.7 F (37.1 C) 98.3 F (36.8 C) 98.2 F (36.8 C) 98.4 F (36.9 C)  TempSrc: Oral Oral Oral Oral  SpO2: 100% 100%      Examination:  General exam: Elderly male, moderately built, frail, cachectic and chronically ill looking lying comfortably propped up in bed. Respiratory system: Clear to auscultation. Respiratory effort normal. Cardiovascular system: S1 & S2 heard, RRR. No  JVD, murmurs, rubs, gallops or clicks. No pedal edema. Gastrointestinal system: Abdomen is nondistended, soft and nontender. No organomegaly or masses felt. Normal bowel sounds heard. Central nervous system: Alert and oriented x2. No  focal neurological deficits.  Hoarseness + + Extremities: Symmetric 5 x 5 power except left lower extremity with movements restricted due to pain.  Left hip surgical site dressing clean and dry.. Skin: No rashes, lesions or ulcers Psychiatry: Judgement and insight appear impaired. Mood & affect appropriate.     Data Reviewed: I have personally reviewed following labs and imaging studies  CBC: Recent Labs  Lab 11/05/17 1640  WBC 7.7  NEUTROABS 6.4  HGB 11.2*  HCT 34.8*  MCV 89.7  PLT 629   Basic Metabolic Panel: Recent Labs  Lab 11/05/17 1640  NA 144  K 3.6  CL 106  CO2 27  GLUCOSE 116*  BUN 14  CREATININE 0.75  CALCIUM 9.2   Liver Function Tests: Recent Labs  Lab 11/05/17 1640  AST 19  ALT 14*  ALKPHOS 52  BILITOT 0.8  PROT 7.3  ALBUMIN 3.9     Recent Results (from the past 240 hour(s))  Surgical pcr screen     Status: None   Collection Time: 11/06/17 12:16 AM  Result Value Ref Range Status   MRSA, PCR NEGATIVE NEGATIVE Final   Staphylococcus aureus NEGATIVE NEGATIVE Final    Comment: (NOTE) The Xpert SA Assay (FDA approved for NASAL specimens in patients 57 years of age and older), is one component of a comprehensive surveillance program. It is not intended to diagnose infection nor to guide or monitor treatment. Performed at [facility], [place] [address], [place], [place] [address]          Radiology Studies: Dg Chest 2 View  Result Date: 11/05/2017 CLINICAL DATA:  Weakness.  Productive cough for 2 days. EXAM: CHEST - 2 VIEW COMPARISON:  09/04/2005. FINDINGS: Cardiac silhouette is normal in size. No mediastinal or hilar masses. There are surgical vascular clips projecting over the right hilum, new since the prior chest radiographs, stable from a PET-CT dated 12/01/2016. Lungs are hyperexpanded but clear. No pleural effusion or pneumothorax. Skeletal structures are demineralized. IMPRESSION: No acute cardiopulmonary disease.  Hyperexpanded lungs consistent with COPD. Electronically Signed   By: Lajean Manes M.D.   On: 11/05/2017 17:30   Ct Chest Wo Contrast  Result Date: 11/05/2017 CLINICAL DATA:  83 year old male with history of right upper lobectomy for lung cancer demonstrating 2 synchronous primary bronchogenic carcinoma is, one adenocarcinoma and one squamous cell carcinoma with left lower lobe pulmonary nodule seen on PET-CT dated 12/01/2016. EXAM: CT CHEST WITHOUT CONTRAST TECHNIQUE: Multidetector CT imaging of the chest was performed following the standard protocol without IV contrast. COMPARISON:  12/01/2016 PET-CT FINDINGS: Cardiovascular: Heart size is normal. Left main and three-vessel coronary arteriosclerosis. No significant pericardial effusion or pleural thickening. Nonaneurysmal atherosclerotic aorta. Unremarkable noncontrast appearance of the pulmonary vasculature. Mediastinum/Nodes: No pathologically enlarged lymph nodes within the mediastinum. Assessment for hilar lymphadenopathy is limited due to lack of IV contrast. Patent trachea and mainstem bronchi. Unremarkable esophagus. Lungs/Pleura: Larger spiculated mass in the left lower lobe now measuring 14 x 15 x 11 mm versus 7 mm previously. Additional 5 x 2 x 7 mm spiculation seen in the left lower lobe. No effusion or pneumothorax. Paraseptal emphysema at the left lung apex. Right upper lobectomy postop change. Upper Abdomen: No acute abnormality. Musculoskeletal: No aggressive osseous lesions. IMPRESSION: 1. Interval increase in size  of left lower lobe pulmonary spiculated lesion now measuring 14 x 15 x 11 mm versus 7 mm previously. Additional 5 x 2 x 7 mm spiculated focus in the left lower lobe abutting the diaphragmatic pleura. 2. Resolution of subpleural inflammatory change in the right lower lobe since prior PET-CT. 3. Aortic atherosclerosis with left main and three-vessel coronary arteriosclerosis. Aortic Atherosclerosis (ICD10-I70.0) and Emphysema  (ICD10-J43.9). Electronically Signed   By: Ashley Royalty M.D.   On: 11/05/2017 21:17   Pelvis Portable  Result Date: 11/06/2017 CLINICAL DATA:  Left hip replacement surgery EXAM: PORTABLE PELVIS 1-2 VIEWS COMPARISON:  the previous day's study FINDINGS: Interval placement of left hip arthroplasty components, projecting in expected location. No fracture or dislocation. Lateral skin staples. Stable pelvic vascular clips. Patchy iliofemoral arterial calcifications. IMPRESSION: 1. Left hip arthroplasty without apparent complication. Electronically Signed   By: Lucrezia Europe M.D.   On: 11/06/2017 12:13   Dg C-arm 1-60 Min-no Report  Result Date: 11/06/2017 Fluoroscopy was utilized by the requesting physician.  No radiographic interpretation.   Dg Hip Operative Unilat With Pelvis Left  Result Date: 11/06/2017 CLINICAL DATA:  Femoral neck fracture EXAM: OPERATIVE LEFT HIP (WITH PELVIS IF PERFORMED) 2 VIEWS TECHNIQUE: Fluoroscopic spot image(s) were submitted for interpretation post-operatively. COMPARISON:  the previous day's study FINDINGS: A series of fluoroscopic spot images document placement of left hip arthroplasty hardware, projecting in expected location. No interval fracture or other apparent complication. IMPRESSION: 1. Interval left hip arthroplasty. Electronically Signed   By: Lucrezia Europe M.D.   On: 11/06/2017 10:07   Dg Femur Min 2 Views Left  Result Date: 11/05/2017 CLINICAL DATA:  Fall.  Left thigh pain. EXAM: LEFT FEMUR 2 VIEWS COMPARISON:  No recent prior. FINDINGS: Peripheral vascular calcification. Degenerative change left hip and knee. Displaced left femoral neck fracture is present. IMPRESSION: 1.  Displaced left femoral neck fracture. 2.  Degenerative changes left hip and knee. 3.  Peripheral vascular disease. Electronically Signed   By: [place]   On: 11/05/2017 15:13        Scheduled Meds: . amLODipine  5 mg Oral Daily  . [START ON 11/07/2017] aspirin EC  325 mg Oral Q  breakfast  . atorvastatin  40 mg Oral QHS  . docusate sodium  100 mg Oral BID  . famotidine  40 mg Oral QHS  . metoprolol tartrate  25 mg Oral BID  . pantoprazole  40 mg Oral BID  . tamsulosin  0.4 mg Oral Daily   Continuous Infusions: . ceFAZolin    .  ceFAZolin (ANCEF) IV 2 g (11/06/17 1341)  . methocarbamol (ROBAXIN)  IV Stopped (11/06/17 1055)  . phenylephrine       LOS: 1 day     Vernell Leep, MD, FACP, [facility]. [facility] Pager [phone]  If 7PM-7AM, please contact night-coverage [email or web address] Password [facility] 11/06/2017, 3:51 PM

## 2017-11-06 NOTE — Progress Notes (Signed)
Extremely restless and uncooperative, attempting to get out of bed, medicated with Fentanyl as ordered.

## 2017-11-06 NOTE — Brief Op Note (Signed)
11/06/2017  9:41 AM  PATIENT:  Andre Silva  83 y.o. male  PRE-OPERATIVE DIAGNOSIS:  LEFT FEMORAL NECK FRACTURE  POST-OPERATIVE DIAGNOSIS:  LEFT FEMORAL NECK FRACTURE  PROCEDURE:  Procedure(s): TOTAL HIP ARTHROPLASTY ANTERIOR APPROACH (Left)  SURGEON:  Surgeon(s) and Role:    Mcarthur Rossetti, MD - Primary  ANESTHESIA:   general  EBL:  250 mL   COUNTS:  YES  DICTATION: .Other Dictation: Dictation Number [id number]  PLAN OF CARE: Admit to inpatient   PATIENT DISPOSITION:  PACU - hemodynamically stable.   Delay start of Pharmacological VTE agent (>24hrs) due to surgical blood loss or risk of bleeding: no

## 2017-11-06 NOTE — Anesthesia Procedure Notes (Signed)
Date/Time: 11/06/2017 9:45 AM Performed by: Cynda Familia, CRNA Oxygen Delivery Method: Simple face mask Placement Confirmation: positive ETCO2 and breath sounds checked- equal and bilateral Dental Injury: Teeth and Oropharynx as per pre-operative assessment

## 2017-11-06 NOTE — Op Note (Signed)
NAME: ARNET, HOFFERBER MEDICAL RECORD WU:[id number] ACCOUNT [id number] DATE OF BIRTH:20-Apr-1935 FACILITY: [facility] LOCATION: [facility] PHYSICIAN:Ezrael Sam Kerry Fort, MD  OPERATIVE REPORT  DATE OF PROCEDURE:  11/06/2017  PREOPERATIVE DIAGNOSIS:  Displaced left hip femoral neck fracture.  POSTOPERATIVE DIAGNOSIS:  Displaced left hip femoral neck fracture.  PROCEDURE:  Left total hip arthroplasty through direct anterior approach.  IMPLANTS:  DePuy Sector Gription acetabular component size 54, size 36+0 polyethylene liner, size 13 Corail femoral component with standard offset, size 36+1.5 metal hip ball.  SURGEON:  Lind Guest. Ninfa Linden, MD  ASSISTANT:  OR staff.  ANTIBIOTICS:  Two grams IV Ancef.  ESTIMATED BLOOD LOSS:  250 mL.  COMPLICATIONS:  None.  INDICATIONS:  The patient is an 83 year old gentleman who sustained a mechanical fall yesterday.  He had the inability to ambulate after this and a severely displaced left hip femoral neck fracture.  He is a Hydrographic surveyor and drives and only  occasionally uses a cane.  I feel that the best treatment would be a total hip arthroplasty through a direct anterior approach.  I explained to him what this surgery involves, including a discussion of the risks and benefits of surgery.  He was admitted  to the medicine service and cleared for surgery and is presenting to the operating room now for this.  DESCRIPTION OF PROCEDURE:  After informed consent was obtained and appropriate the left hip was marked, he was brought to the operating room.  General anesthesia was obtained while he was on a stretcher.  Traction boots were placed on both his feet.   Next, he was placed supine on the Hana fracture table with a perineal post in place and both legs in an in-line skeletal traction device with no traction applied.  His left operative hip was prepped and draped with DuraPrep and sterile drapes.  A  time-out was called identifying the correct  patient and correct left hip.  I then made an incision just inferior and posterior to the anterior superior iliac spine and carried this obliquely down the leg.  We dissected down tensor fascia lata muscle.   Tensor fascia was then divided longitudinally to proceed with an anterior approach to the hip.  We identified and cauterized the circumflex vessels.  We identified the hip capsule and opened the hip capsule in an L-type format, finding a very large  hemarthrosis and hematoma consistent with a displaced femoral neck fracture.  We then placed a Cobra retractor around the remnants of the medial and lateral femoral neck and made our femoral neck cut just distal to the fracture but proximal to the lesser  trochanter and completed this with an osteotome.  I then placed a corkscrew guide in the femoral head and removed the femoral head in its entirety.  I then cleaned the remnants of the acetabular labrum and other debris and removed bony fragments.  I  placed a bent Hohmann at the acetabular rim.  I then began reaming under direct visualization from a small size 43 reamer, going in stepwise increments up to a size 53 with all reamers under direct visualization, the last reamer under direct fluoroscopy  so I could obtain our depth in reaming and our inclination and anteversion.  Once I was pleased with this, I placed the real DePuy Sector Gription acetabular component size 54 and a 36+0 polyethylene liner for that size acetabular component.  Attention  was then turned to the femur with the leg externally rotated 120 degrees, extended and adducted.  We were able to place a Mueller retractor medially and a Hohmann retractor around the greater trochanter.  We released the joint capsule and used a  box-cutting osteotome to enter the femoral canal and a rongeur to lateralize and then began broaching from a size 8 broach using the Corail broaching system up to a size 13.  With the 13 in place, we trialed a standard  offset femoral neck with a 36+1.5  hip ball and reduced this in the acetabulum.  We were pleased with leg length, offset, range of motion and stability.  We then irrigated the soft tissues with normal saline solution using pulsatile lavage.  We dislocated the hip and removed the trial  components.  We then placed the real Corail femoral component with standard offset size 13 and the real 36+1.5 metal hip ball and reduced this in the acetabulum again.  I was pleased with the stability.  We then closed the joint capsule with interrupted  #1 Ethibond suture, followed by running 0 Vicryl in tensor fascia, 0 Vicryl in the deep tissue, 2-0 Vicryl subcutaneous tissue and interrupted staples on the skin.  A Xeroform well-padded sterile dressing was applied.  He was taken off the Hana table,  awakened, extubated, and taken to recovery room in stable condition.  All final counts were correct.  There were no complications noted.  LN/NUANCE  D:11/06/2017 T:11/06/2017 JOB:[id number]

## 2017-11-06 NOTE — Anesthesia Preprocedure Evaluation (Signed)
Anesthesia Evaluation  Patient identified by MRN, date of birth, ID band Patient awake    Reviewed: Allergy & Precautions, NPO status , Patient's Chart, lab work & pertinent test results, reviewed documented beta blocker date and time   Airway Mallampati: I  TM Distance: >3 FB Neck ROM: Full    Dental  (+) Edentulous Upper, Edentulous Lower   Pulmonary Current Smoker,    breath sounds clear to auscultation       Cardiovascular hypertension, Pt. on medications and Pt. on home beta blockers  Rhythm:Regular Rate:Normal     Neuro/Psych TIAnegative psych ROS   GI/Hepatic Neg liver ROS, GERD  Medicated,  Endo/Other  negative endocrine ROS  Renal/GU negative Renal ROS     Musculoskeletal negative musculoskeletal ROS (+)   Abdominal Normal abdominal exam  (+)   Peds  Hematology negative hematology ROS (+)   Anesthesia Other Findings   Reproductive/Obstetrics                             Lab Results  Component Value Date   WBC 7.7 11/05/2017   HGB 11.2 (L) 11/05/2017   HCT 34.8 (L) 11/05/2017   MCV 89.7 11/05/2017   PLT 180 11/05/2017   Lab Results  Component Value Date   CREATININE 0.75 11/05/2017   BUN 14 11/05/2017   NA 144 11/05/2017   K 3.6 11/05/2017   CL 106 11/05/2017   CO2 27 11/05/2017   Lab Results  Component Value Date   INR 0.92 09/17/2013     Anesthesia Physical Anesthesia Plan  ASA: II  Anesthesia Plan: General   Post-op Pain Management:    Induction: Intravenous  PONV Risk Score and Plan: Ondansetron  Airway Management Planned: Oral ETT  Additional Equipment: None  Intra-op Plan:   Post-operative Plan: Extubation in OR  Informed Consent: I have reviewed the patients History and Physical, chart, labs and discussed the procedure including the risks, benefits and alternatives for the proposed anesthesia with the patient or authorized representative who  has indicated his/her understanding and acceptance.   Dental advisory given  Plan Discussed with: CRNA  Anesthesia Plan Comments:         Anesthesia Quick Evaluation

## 2017-11-06 NOTE — Anesthesia Postprocedure Evaluation (Signed)
Anesthesia Post Note  Patient: Andre Silva  Procedure(s) Performed: TOTAL HIP ARTHROPLASTY ANTERIOR APPROACH (Left Hip)     Patient location during evaluation: PACU Anesthesia Type: General Level of consciousness: awake and alert Pain management: pain level controlled Vital Signs Assessment: post-procedure vital signs reviewed and stable Respiratory status: spontaneous breathing, nonlabored ventilation, respiratory function stable and patient connected to nasal cannula oxygen Cardiovascular status: blood pressure returned to baseline and stable Postop Assessment: no apparent nausea or vomiting Anesthetic complications: no    Last Vitals:  Vitals:   11/06/17 1130 11/06/17 1148  BP: 122/77 121/79  Pulse: (!) 102 (!) 102  Resp: 12 18  Temp: 36.9 C 37.1 C  SpO2: 98% 100%    Last Pain:  Vitals:   11/06/17 1148  TempSrc: Oral  PainSc:                  [name]

## 2017-11-06 NOTE — Progress Notes (Signed)
Patient ID: Andre Silva, male   DOB: 1934-09-02, 83 y.o.   MRN: [id number] The patient understands and we will proceed to surgery this morning for left hip replacement to treat his left hip femoral neck fracture.  Risk and benefits of been explained in detail and informed consent is obtained.

## 2017-11-06 NOTE — Transfer of Care (Signed)
Immediate Anesthesia Transfer of Care Note  Patient: Andre Silva  Procedure(s) Performed: TOTAL HIP ARTHROPLASTY ANTERIOR APPROACH (Left Hip)  Patient Location: PACU  Anesthesia Type:General  Level of Consciousness: sedated  Airway & Oxygen Therapy: Patient Spontanous Breathing and Patient connected to face mask oxygen  Post-op Assessment: Report given to RN and Post -op Vital signs reviewed and stable  Post vital signs: Reviewed and stable  Last Vitals:  Vitals Value Taken Time  BP 189/90 11/06/2017  9:51 AM  Temp    Pulse    Resp 12 11/06/2017  9:52 AM  SpO2    Vitals shown include unvalidated device data.  Last Pain:  Vitals:   11/06/17 0614  TempSrc:   PainSc: 0-No pain      Patients Stated Pain Goal: 2 (47/42/59 [id number])  Complications: No apparent anesthesia complications

## 2017-11-06 NOTE — Anesthesia Procedure Notes (Signed)
Procedure Name: Intubation Date/Time: 11/06/2017 8:10 AM Performed by: Cynda Familia, CRNA Pre-anesthesia Checklist: Patient identified, Emergency Drugs available, Suction available and Patient being monitored Patient Re-evaluated:Patient Re-evaluated prior to induction Oxygen Delivery Method: Circle System Utilized Preoxygenation: Pre-oxygenation with 100% oxygen Induction Type: IV induction Ventilation: Mask ventilation without difficulty Laryngoscope Size: Miller and 2 Grade View: Grade I Tube type: Oral Tube size: 7.5 mm Number of attempts: 1 Airway Equipment and Method: Stylet Placement Confirmation: ETT inserted through vocal cords under direct vision,  positive ETCO2 and breath sounds checked- equal and bilateral Secured at: 22 cm Tube secured with: Tape Dental Injury: Teeth and Oropharynx as per pre-operative assessment  Comments: Smooth IV induction Hollis--- intubation AM CRNA atraumatic--- mouth as preop --no teeth-- -bilat BS Hollis

## 2017-11-07 DIAGNOSIS — D62 Acute posthemorrhagic anemia: Secondary | ICD-10-CM

## 2017-11-07 DIAGNOSIS — E43 Unspecified severe protein-calorie malnutrition: Secondary | ICD-10-CM

## 2017-11-07 LAB — BASIC METABOLIC PANEL
ANION GAP: 12 (ref 5–15)
BUN: 19 mg/dL (ref 6–20)
CALCIUM: 8.8 mg/dL — AB (ref 8.9–10.3)
CO2: 27 mmol/L (ref 22–32)
CREATININE: 0.92 mg/dL (ref 0.61–1.24)
Chloride: 100 mmol/L — ABNORMAL LOW (ref 101–111)
GFR calc Af Amer: >60 mL/min (ref >60)
Glucose, Bld: 111 mg/dL — ABNORMAL HIGH (ref 65–99)
Potassium: 3.9 mmol/L (ref 3.5–5.1)
Sodium: 139 mmol/L (ref 135–145)

## 2017-11-07 LAB — CBC
HEMATOCRIT: 29.2 % — AB (ref 39.0–52.0)
Hemoglobin: 9.4 g/dL — ABNORMAL LOW (ref 13.0–17.0)
MCH: 28.7 pg (ref 26.0–34.0)
MCHC: 32.2 g/dL (ref 30.0–36.0)
MCV: 89 fL (ref 78.0–100.0)
PLATELETS: 172 10*3/uL (ref 150–400)
RBC: 3.28 MIL/uL — ABNORMAL LOW (ref 4.22–5.81)
RDW: 18.8 % — ABNORMAL HIGH (ref 11.5–15.5)
WBC: 5.5 10*3/uL (ref 4.0–10.5)

## 2017-11-07 MED ORDER — ACETAMINOPHEN 650 MG RE SUPP: 650.0000 mg | Freq: Four times a day (QID) | RECTAL | Status: DC | PRN

## 2017-11-07 MED ORDER — ENSURE ENLIVE PO LIQD: 237.0000 mL | Freq: Three times a day (TID) | ORAL | Status: DC

## 2017-11-07 MED ORDER — ADULT MULTIVITAMIN LIQUID CH
15.0000 mL | Freq: Every day | ORAL | Status: DC
  Administered 2017-11-07: 15 mL via ORAL
  Filled 2017-11-07: qty 15

## 2017-11-07 MED ORDER — SODIUM CHLORIDE 0.9 % IV SOLN
INTRAVENOUS | Status: DC
  Administered 2017-11-07 – 2017-11-08 (×2): via INTRAVENOUS

## 2017-11-07 MED ORDER — ASPIRIN 300 MG RE SUPP
300.0000 mg | Freq: Every day | RECTAL | Status: DC
  Administered 2017-11-08: 300 mg via RECTAL
  Filled 2017-11-07 (×2): qty 1

## 2017-11-07 MED ORDER — MORPHINE SULFATE (PF) 4 MG/ML IV SOLN
0.5000 mg | INTRAVENOUS | Status: DC | PRN
  Administered 2017-11-07: 1 mg via INTRAVENOUS
  Filled 2017-11-07: qty 1

## 2017-11-07 NOTE — Progress Notes (Signed)
Subjective: Patient stable.  Pain okay.   Objective: Vital signs in last 24 hours: Temp:  [98 F (36.7 C)-99.4 F (37.4 C)] 98 F (36.7 C) (05/19 0645) Pulse Rate:  [85-107] 95 (05/19 0645) Resp:  [12-19] 15 (05/19 0645) BP: (95-189)/(67-95) 95/67 (05/19 0645) SpO2:  [95 %-100 %] 99 % (05/19 0645)  Intake/Output from previous day: 05/18 0701 - 05/19 0700 In: 2025 [P.O.:120; I.V.:1400; IV Piggyback:505] Out: 950 [Urine:700; Blood:250] Intake/Output this shift: No intake/output data recorded.  Exam:  Dorsiflexion/Plantar flexion intact  Labs: Recent Labs    11/05/17 1640 11/07/17 0457  HGB 11.2* 9.4*   Recent Labs    11/05/17 1640 11/07/17 0457  WBC 7.7 5.5  RBC 3.88* 3.28*  HCT 34.8* 29.2*  PLT 180 172   Recent Labs    11/05/17 1640 11/07/17 0457  NA 144 139  K 3.6 3.9  CL 106 100*  CO2 27 27  BUN 14 19  CREATININE 0.75 0.92  GLUCOSE 116* 111*  CALCIUM 9.2 8.8*   No results for input(s): LABPT, INR in the last 72 hours.  Assessment/Plan: Plan today is to mobilize with physical therapy.  Hemoglobin slightly lower but acceptable.  Discharged to skilled nursing next week   G Alphonzo Severance 11/07/2017, 8:10 AM

## 2017-11-07 NOTE — Progress Notes (Signed)
PROGRESS NOTE   Andre Silva  GYI:[id number]    DOB: [birth year]    DOA: 11/05/2017  PCP: [facility]   I have briefly reviewed patients previous medical records in [facility].  Brief Narrative:  83 year old male with PMH of NSCLC RUL status post RUL lobectomy 2016, NSCLC left lower lobe last seen by radiation oncology on 10/26/2016 and appears to have been lost to follow-up, hoarseness/loss of olfactory and taste, HTN, HLD, sustained mechanical fall on 5/17 without LOC and presented with severe left hip pain and admitted for left hip fracture.  Orthopedics consulted and patient underwent left total hip arthroplasty anterior approach on 5/18.   Assessment & Plan:   Principal Problem:   Closed left hip fracture, initial encounter ([place]) Active Problems:   HTN (hypertension)   Primary cancer of right upper lobe of lung ([place])   Presumed primary cancer of left lower lobe of lung ([place])   HLD (hyperlipidemia)   Left femoral neck fracture: Orthopedics/Dr. Ninfa Linden consulted and patient underwent left total hip arthroplasty anterior approach on 5/18.  Management per orthopedics.  PT evaluation appreciated and recommend SNF.  Lung cancer: History as indicated above.  PET scan 12/01/2016: Posterior LLL solid 7 mm pulmonary nodule with low-level metabolism, stable since 10/15/2016, suspicious for a metachronous primary bronchogenic carcinoma.  Cannot exclude nodal metastasis to subcarinal lymph node.  Admitting MD mentions 8 mm irregular nodule on CT 02/17/2017 that was suspicious but I am unable to find that report.  CT chest 5/17: Interval increase in size of LLL pulmonary spiculated lesion, seems to be have doubled in size.  Additional spiculated focus in left lower lobe.  Concern for recurrent lung cancer.  I discussed in detail on 5/18 with patient and patient's son at bedside regarding importance of follow-up and they verbalized understanding.  I discussed with Dr. Burr Medico, Oncology on  5/18 and sent an in basket message to her and Dr. Tammi Klippel (Radiation Oncology who had seen him previously) and she will follow-up & have patient plugged back for outpatient follow-up.  Essential hypertension: Controlled.  Continue amlodipine and metoprolol.  Blood pressures on the soft side likely related to acute blood loss.  Will add holding parameters.  Hyperlipidemia: Continue statins.  Acute blood loss anemia: Related to fracture and surgical blood loss.  Hemoglobin dropped from 11.2 preop to 9.4 today.  Follow CBC in a.m. and transfuse if hemoglobin <7 g per DL.  Tobacco abuse: Cessation counseled.  Not on home oxygen.  COPD: Seen on imaging studies.  No clinical bronchospasm.  Monitor.  Aortic atherosclerosis/CAD: Seen on CT chest.  No chest pain.  Hoarseness: Unclear etiology.?  Related to cancer or prior treatment.  Oncologist were attempting to get previous records as outpatient.  Outpatient follow-up.  Due to congested cough, requested speech therapy to evaluate swallowing to rule out dysphagia.   DVT prophylaxis: SCDs and aspirin. Code Status: Full Family Communication: None at bedside today.  I did discuss with patient's son at bedside on 5/18. Disposition: To be determined, likely will require SNF when medically ready, possibly 5/20   Consultants:  Orthopedics/Dr. Ninfa Linden  Procedures:  Left total hip arthroplasty anterior approach on 5/18  Antimicrobials:  None   Subjective: Patient denies complaints.  Denies much pain at surgical left hip site.  As per RN, no acute issues reported.  ROS: As above.  Objective:  Vitals:   11/07/17 0645 11/07/17 0932 11/07/17 1038 11/07/17 1322  BP: 95/67 (!) 84/71 96/66 Marland Kitchen)  88/61  Pulse: 95 95 95 99  Resp: 15 14 15 14   Temp: 98 F (36.7 C) 98.3 F (36.8 C)  98.5 F (36.9 C)  TempSrc: Oral Oral  Oral  SpO2: 99% 96% 94% 96%    Examination:  General exam: Elderly male, moderately built, frail, cachectic and  chronically ill looking lying comfortably propped up in bed. Respiratory system: Clear to auscultation. Respiratory effort normal.  Not hypoxic on room air. Cardiovascular system: S1 & S2 heard, RRR. No JVD, murmurs, rubs, gallops or clicks. No pedal edema. Gastrointestinal system: Abdomen is nondistended, soft and nontender. No organomegaly or masses felt. Normal bowel sounds heard. Central nervous system: Alert and oriented x2. No focal neurological deficits.  Hoarseness + + and difficult to understand speech. Extremities: Symmetric 5 x 5 power except left lower extremity with movements restricted due to pain.  Left hip surgical site dressing clean and dry.  Stable. Skin: No rashes, lesions or ulcers Psychiatry: Judgement and insight appear impaired. Mood & affect appropriate.     Data Reviewed: I have personally reviewed following labs and imaging studies  CBC: Recent Labs  Lab 11/05/17 1640 11/07/17 0457  WBC 7.7 5.5  NEUTROABS 6.4  --   HGB 11.2* 9.4*  HCT 34.8* 29.2*  MCV 89.7 89.0  PLT 180 169   Basic Metabolic Panel: Recent Labs  Lab 11/05/17 1640 11/07/17 0457  NA 144 139  K 3.6 3.9  CL 106 100*  CO2 27 27  GLUCOSE 116* 111*  BUN 14 19  CREATININE 0.75 0.92  CALCIUM 9.2 8.8*   Liver Function Tests: Recent Labs  Lab 11/05/17 1640  AST 19  ALT 14*  ALKPHOS 52  BILITOT 0.8  PROT 7.3  ALBUMIN 3.9     Recent Results (from the past 240 hour(s))  Surgical pcr screen     Status: None   Collection Time: 11/06/17 12:16 AM  Result Value Ref Range Status   MRSA, PCR NEGATIVE NEGATIVE Final   Staphylococcus aureus NEGATIVE NEGATIVE Final    Comment: (NOTE) The Xpert SA Assay (FDA approved for NASAL specimens in patients 67 years of age and older), is one component of a comprehensive surveillance program. It is not intended to diagnose infection nor to guide or monitor treatment. Performed at [facility], [place] [address] [place],  [place] [address]          Radiology Studies: Dg Chest 2 View  Result Date: 11/05/2017 CLINICAL DATA:  Weakness.  Productive cough for 2 days. EXAM: CHEST - 2 VIEW COMPARISON:  09/04/2005. FINDINGS: Cardiac silhouette is normal in size. No mediastinal or hilar masses. There are surgical vascular clips projecting over the right hilum, new since the prior chest radiographs, stable from a PET-CT dated 12/01/2016. Lungs are hyperexpanded but clear. No pleural effusion or pneumothorax. Skeletal structures are demineralized. IMPRESSION: No acute cardiopulmonary disease. Hyperexpanded lungs consistent with COPD. Electronically Signed   By: Lajean Manes M.D.   On: 11/05/2017 17:30   Ct Chest Wo Contrast  Result Date: 11/05/2017 CLINICAL DATA:  83 year old male with history of right upper lobectomy for lung cancer demonstrating 2 synchronous primary bronchogenic carcinoma is, one adenocarcinoma and one squamous cell carcinoma with left lower lobe pulmonary nodule seen on PET-CT dated 12/01/2016. EXAM: CT CHEST WITHOUT CONTRAST TECHNIQUE: Multidetector CT imaging of the chest was performed following the standard protocol without IV contrast. COMPARISON:  12/01/2016 PET-CT FINDINGS: Cardiovascular: Heart size is normal. Left main and three-vessel coronary arteriosclerosis.  No significant pericardial effusion or pleural thickening. Nonaneurysmal atherosclerotic aorta. Unremarkable noncontrast appearance of the pulmonary vasculature. Mediastinum/Nodes: No pathologically enlarged lymph nodes within the mediastinum. Assessment for hilar lymphadenopathy is limited due to lack of IV contrast. Patent trachea and mainstem bronchi. Unremarkable esophagus. Lungs/Pleura: Larger spiculated mass in the left lower lobe now measuring 14 x 15 x 11 mm versus 7 mm previously. Additional 5 x 2 x 7 mm spiculation seen in the left lower lobe. No effusion or pneumothorax. Paraseptal emphysema at the left lung apex. Right upper lobectomy  postop change. Upper Abdomen: No acute abnormality. Musculoskeletal: No aggressive osseous lesions. IMPRESSION: 1. Interval increase in size of left lower lobe pulmonary spiculated lesion now measuring 14 x 15 x 11 mm versus 7 mm previously. Additional 5 x 2 x 7 mm spiculated focus in the left lower lobe abutting the diaphragmatic pleura. 2. Resolution of subpleural inflammatory change in the right lower lobe since prior PET-CT. 3. Aortic atherosclerosis with left main and three-vessel coronary arteriosclerosis. Aortic Atherosclerosis (ICD10-I70.0) and Emphysema (ICD10-J43.9). Electronically Signed   By: Ashley Royalty M.D.   On: 11/05/2017 21:17   Pelvis Portable  Result Date: 11/06/2017 CLINICAL DATA:  Left hip replacement surgery EXAM: PORTABLE PELVIS 1-2 VIEWS COMPARISON:  the previous day's study FINDINGS: Interval placement of left hip arthroplasty components, projecting in expected location. No fracture or dislocation. Lateral skin staples. Stable pelvic vascular clips. Patchy iliofemoral arterial calcifications. IMPRESSION: 1. Left hip arthroplasty without apparent complication. Electronically Signed   By: Lucrezia Europe M.D.   On: 11/06/2017 12:13   Dg C-arm 1-60 Min-no Report  Result Date: 11/06/2017 Fluoroscopy was utilized by the requesting physician.  No radiographic interpretation.   Dg Hip Operative Unilat With Pelvis Left  Result Date: 11/06/2017 CLINICAL DATA:  Femoral neck fracture EXAM: OPERATIVE LEFT HIP (WITH PELVIS IF PERFORMED) 2 VIEWS TECHNIQUE: Fluoroscopic spot image(s) were submitted for interpretation post-operatively. COMPARISON:  the previous day's study FINDINGS: A series of fluoroscopic spot images document placement of left hip arthroplasty hardware, projecting in expected location. No interval fracture or other apparent complication. IMPRESSION: 1. Interval left hip arthroplasty. Electronically Signed   By: Lucrezia Europe M.D.   On: 11/06/2017 10:07   Dg Femur Min 2 Views  Left  Result Date: 11/05/2017 CLINICAL DATA:  Fall.  Left thigh pain. EXAM: LEFT FEMUR 2 VIEWS COMPARISON:  No recent prior. FINDINGS: Peripheral vascular calcification. Degenerative change left hip and knee. Displaced left femoral neck fracture is present. IMPRESSION: 1.  Displaced left femoral neck fracture. 2.  Degenerative changes left hip and knee. 3.  Peripheral vascular disease. Electronically Signed   By: [place]   On: 11/05/2017 15:13        Scheduled Meds: . amLODipine  5 mg Oral Daily  . aspirin EC  325 mg Oral Q breakfast  . atorvastatin  40 mg Oral QHS  . docusate sodium  100 mg Oral BID  . famotidine  40 mg Oral QHS  . feeding supplement (ENSURE ENLIVE)  237 mL Oral TID BM  . metoprolol tartrate  25 mg Oral BID  . multivitamin  15 mL Oral Daily  . pantoprazole  40 mg Oral BID  . tamsulosin  0.4 mg Oral Daily   Continuous Infusions: . methocarbamol (ROBAXIN)  IV Stopped (11/06/17 1751)     LOS: 2 days     Vernell Leep, MD, FACP, [facility]. [facility] Pager [phone]  If 7PM-7AM, please contact night-coverage [email or web address] Password [email or web address]  11/07/2017, 1:36 PM

## 2017-11-07 NOTE — Progress Notes (Signed)
Initial Nutrition Assessment  DOCUMENTATION CODES:   Severe malnutrition in context of chronic illness  INTERVENTION:   -Provide Ensure Enlive po TID, each supplement provides 350 kcal and 20 grams of protein -Changed diet from GI soft diet to mechanical soft (dysphagia 3). -Ordered weight to be measured (last recorded in May 2018) -Provide liquid MVI  NUTRITION DIAGNOSIS:   Severe Malnutrition related to chronic illness, cancer and cancer related treatments as evidenced by energy intake < or equal to 75% for > or equal to 1 month, severe fat depletion, severe muscle depletion.  GOAL:   Patient will meet greater than or equal to 90% of their needs  MONITOR:   PO intake, Supplement acceptance, Labs, Weight trends, I & O's  REASON FOR ASSESSMENT:   Consult Hip fracture protocol  ASSESSMENT:   83 year old male with PMH of NSCLC RUL status post RUL lobectomy 2016, NSCLC left lower lobe last seen by radiation oncology on 10/26/2016 and appears to have been lost to follow-up, hoarseness/loss of olfactory and taste, HTN, HLD, sustained mechanical fall on 5/17 without LOC and presented with severe left hip pain and admitted for left hip fracture.  Orthopedics consulted and patient underwent left total hip arthroplasty anterior approach on 5/18.  Patient in room sitting in chair. Pt is hard to understand given hoarseness and pt was consistently clearing his throat. Pt reports poor intakes given that he has no teeth and is unable to chew foods well. He reports swallowing difficulty as well. Per chart, SLP to evaluate today.   Pt states he receives Ensure supplements "in [place]". Pt is currently drinking a strawberry Ensure. Will order for TID. Also will change diet to mechanical soft as pt states it would be easier to swallow if his foods were chopped. Will monitor for SLP recommendations.  No current weight in chart records. Last weight is from May 2018. Will order weight to be  obtained.  Labs reviewed. Medications: Colace capsule BID, Liquid multivitamin with minerals daily   NUTRITION - FOCUSED PHYSICAL EXAM:    Most Recent Value  Orbital Region  Mild depletion  Upper Arm Region  Severe depletion  Thoracic and Lumbar Region  Unable to assess  Buccal Region  Moderate depletion  Temple Region  Moderate depletion  Clavicle Bone Region  Severe depletion  Clavicle and Acromion Bone Region  Severe depletion  Scapular Bone Region  Severe depletion  Dorsal Hand  Severe depletion  Patellar Region  Unable to assess  Anterior Thigh Region  Unable to assess  Posterior Calf Region  Unable to assess  Edema (RD Assessment)  None       Diet Order:   Diet Order           DIET DYS 3 Room service appropriate? No; Fluid consistency: Thin  Diet effective now          EDUCATION NEEDS:   No education needs have been identified at this time  Skin:  Skin Assessment: Skin Integrity Issues: Skin Integrity Issues:: Incisions Incisions: left hip  Last BM:  5/17  Height:   Ht Readings from Last 1 Encounters:  10/26/16 5\' 7"  (1.702 m)    Weight:   Wt Readings from Last 1 Encounters:  10/26/16 110 lb (49.9 kg)    Ideal Body Weight:  67.3 kg  BMI: Unable to calculate   Estimated Nutritional Needs:   Kcal:  2000-2200 Used IBW  Protein:  100-110g "  Fluid:  2L/day "  Clayton Bibles, MS, RD,  [place] Dietitian Pager: [phone] After Hours Pager: [phone]

## 2017-11-07 NOTE — Progress Notes (Signed)
Speech therapist returned page & said Mr Kloth would likely be done today. Neely Kammerer, [employer]

## 2017-11-07 NOTE — Progress Notes (Signed)
Addendum  Registered dietitian input appreciated: Severe malnutrition in context of chronic illness.  Ordered nutritional supplements currently held due to reasons below.  Discussed with speech therapy who indicate that patient is having trouble managing secretions, swallowing and recommend NPO until [facility] 5/20.  Discontinued all oral medications.  Since blood pressures on the softer side, okay to DC antihypertensives as well.  Monitor.  Vernell Leep, MD, FACP, [facility]. [facility] Pager [phone]  If 7PM-7AM, please contact night-coverage [email or web address] Password [email or web address] 11/07/2017, 2:45 PM

## 2017-11-07 NOTE — Evaluation (Addendum)
Physical Therapy Evaluation Patient Details Name: Andre Silva MRN: [id number] DOB: Feb 09, 1935 Today's Date: 11/07/2017   History of Present Illness  Andre Silva is a 83 y.o. male adm after mechanical fall resulting in femoral neck fx, s/p L DA THA;  medical history significant of NSCLC s/p RUL lobectomy in 2016.  95mm irregular nodule in LLL suspicious for recurrence in Aug 2018, has not followed up with oncology  Clinical Impression  Pt admitted with above diagnosis. Pt currently with functional limitations due to the deficits listed below (see PT Problem List). Pt doing well with PT today,amb 41' with RW and min assist--unsure of baseline functional status or level of home support;  Pt will benefit from skilled PT to increase their independence and safety with mobility to allow discharge to the venue listed below.       Follow Up Recommendations SNF(unless has assist at home (?))    Equipment Recommendations  Rolling walker with 5" wheels    Recommendations for Other Services       Precautions / Restrictions Precautions Precautions: Fall Restrictions Weight Bearing Restrictions: (P) No      Mobility  Bed Mobility Overal bed mobility: Needs Assistance Bed Mobility: Supine to Sit     Supine to sit: Min assist     General bed mobility comments: assist with LLE, incr time  Transfers Overall transfer level: Needs assistance Equipment used: Rolling walker (2 wheeled) Transfers: Sit to/from Stand Sit to Stand: Min assist         General transfer comment: assist to rise and stabilize, cues for hand placement and safety  Ambulation/Gait Ambulation/Gait assistance: Supervision Ambulation Distance (Feet): 60 Feet Assistive device: Rolling walker (2 wheeled) Gait Pattern/deviations: Step-to pattern;Step-through pattern;Decreased stride length;Decreased weight shift to left     General Gait Details: cues for RW safety  Stairs            Wheelchair  Mobility    Modified Rankin (Stroke Patients Only)       Balance                                             Pertinent Vitals/Pain Pain Assessment: Faces Faces Pain Scale: Hurts little more Pain Location: L hip Pain Descriptors / Indicators: Grimacing;Guarding Pain Intervention(s): Monitored during session;Repositioned;Ice applied    Home Living Family/patient expects to be discharged to:: Private residence Living Arrangements: Non-relatives/Friends(per chart)--per pt lives with "lady friend"               Additional Comments: pt is unreliable historian, he states he has a RW and cane but doesn't use them, he reports he walks "sometimes"; no family present at time of eval    Prior Function Level of Independence: Independent         Comments: per pt--unsure of accuracy of above     Hand Dominance        Extremity/Trunk Assessment   Upper Extremity Assessment Upper Extremity Assessment: Generalized weakness    Lower Extremity Assessment Lower Extremity Assessment: Generalized weakness;LLE deficits/detail LLE Deficits / Details: hip limited by pain/muscle guarding, at least 3/5, [facility]       Communication   Communication: Other (comment)(difficult to understand at times)  Cognition Arousal/Alertness: Awake/alert Behavior During Therapy: WFL for tasks assessed/performed Overall Cognitive Status: No family/caregiver present to determine baseline cognitive functioning  General Comments      Exercises     Assessment/Plan    PT Assessment Patient needs continued PT services  PT Problem List Decreased strength;Decreased range of motion;Decreased mobility;Decreased activity tolerance;Decreased knowledge of use of DME;Pain;Decreased safety awareness       PT Treatment Interventions DME instruction;Gait training;Functional mobility training;Therapeutic activities;Therapeutic  exercise;Patient/family education    PT Goals (Current goals can be found in the Care Plan section)  Acute Rehab PT Goals Patient Stated Goal: none stated PT Goal Formulation: With patient Time For Goal Achievement: 11/14/17 Potential to Achieve Goals: Good    Frequency Min 3X/week   Barriers to discharge        Co-evaluation               AM-PAC PT "6 Clicks" Daily Activity  Outcome Measure Difficulty turning over in bed (including adjusting bedclothes, sheets and blankets)?: Unable Difficulty moving from lying on back to sitting on the side of the bed? : Unable Difficulty sitting down on and standing up from a chair with arms (e.g., wheelchair, bedside commode, etc,.)?: Unable Help needed moving to and from a bed to chair (including a wheelchair)?: A Little Help needed walking in hospital room?: A Little Help needed climbing 3-5 steps with a railing? : A Lot 6 Click Score: 11    End of Session Equipment Utilized During Treatment: Gait belt Activity Tolerance: Patient tolerated treatment well Patient left: in chair;with chair alarm set;with call bell/phone within reach   PT Visit Diagnosis: Difficulty in walking, not elsewhere classified (R26.2)    Time: 4801-6553 PT Time Calculation (min) (ACUTE ONLY): 19 min   Charges:   PT Evaluation $PT Eval Low Complexity: 1 Low     PT G CodesKenyon Ana, PT Pager: [phone] 11/07/2017   [facility] 11/07/2017, 12:58 PM

## 2017-11-07 NOTE — [place] FL2 (Signed)
[place] MEDICAID FL2 LEVEL OF CARE SCREENING TOOL     IDENTIFICATION  Patient Name: Andre Silva Birthdate: [birth year] Sex: male Admission Date (Current Location): 11/05/2017  [facility] and [place] Number:  Herbalist and Address:  [facility],  [place] [address] [place]      Provider Number: [id number]  Attending Physician Name and Address:  Modena Jansky, MD  Relative Name and Phone Number:       Current Level of Care: Hospital Recommended Level of Care: [place] Prior Approval Number:    Date Approved/Denied:   PASRR Number: [id number]  Discharge Plan: SNF    Current Diagnoses: Patient Active Problem List   Diagnosis Date Noted  . Closed left hip fracture, initial encounter ([place]) 11/05/2017  . HLD (hyperlipidemia) 11/05/2017  . Presumed primary cancer of left lower lobe of lung ([place]) 10/15/2016  . Primary cancer of right upper lobe of lung ([place]) 04/05/2015  . HTN (hypertension) 09/18/2013  . Syncope and collapse 09/18/2013  . TIA (transient ischemic attack) 09/17/2013    Orientation RESPIRATION BLADDER Height & Weight     Self, Time, Place  Normal Incontinent, External catheter Weight:   Height:     BEHAVIORAL SYMPTOMS/MOOD NEUROLOGICAL BOWEL NUTRITION STATUS      Continent Diet(dysphasia III diet, thin fluid consistency)  AMBULATORY STATUS COMMUNICATION OF NEEDS Skin   Extensive Assist Verbally Surgical wounds(closed incision left hip silver hydrofiber )                       Personal Care Assistance Level of Assistance  Bathing, Feeding, Dressing Bathing Assistance: Maximum assistance Feeding assistance: Independent Dressing Assistance: Limited assistance     Functional Limitations Info  Sight, Hearing, Speech Sight Info: Adequate Hearing Info: Adequate Speech Info: Impaired    SPECIAL CARE FACTORS FREQUENCY  PT (By licensed PT), OT (By licensed OT), Speech therapy     PT Frequency:  5x OT Frequency: 5x     Speech Therapy Frequency: 1x      Contractures Contractures Info: Not present    Additional Factors Info  Code Status, Allergies Code Status Info: full code Allergies Info: nka           Current Medications (11/07/2017):  This is the current hospital active medication list Current Facility-Administered Medications  Medication Dose Route Frequency Provider Last Rate Last Dose  . acetaminophen (TYLENOL) tablet 325-650 mg  325-650 mg Oral Q6H PRN Mcarthur Rossetti, MD      . alum & mag hydroxide-simeth (MAALOX/MYLANTA) 200-200-20 MG/5ML suspension 30 mL  30 mL Oral Q4H PRN Mcarthur Rossetti, MD      . amLODipine (NORVASC) tablet 5 mg  5 mg Oral Daily Mcarthur Rossetti, MD   5 mg at 11/07/17 1044  . aspirin EC tablet 325 mg  325 mg Oral Q breakfast Mcarthur Rossetti, MD   325 mg at 11/07/17 8676  . atorvastatin (LIPITOR) tablet 40 mg  40 mg Oral QHS Mcarthur Rossetti, MD   40 mg at 11/06/17 2156  . docusate sodium (COLACE) capsule 100 mg  100 mg Oral BID Mcarthur Rossetti, MD   100 mg at 11/07/17 1044  . famotidine (PEPCID) tablet 40 mg  40 mg Oral QHS Mcarthur Rossetti, MD   40 mg at 11/06/17 2157  . feeding supplement (ENSURE ENLIVE) (ENSURE ENLIVE) liquid 237 mL  237 mL Oral TID BM Hongalgi, Lenis Dickinson, MD      .  HYDROcodone-acetaminophen (NORCO) 7.5-325 MG per tablet 1-2 tablet  1-2 tablet Oral Q4H PRN Mcarthur Rossetti, MD      . HYDROcodone-acetaminophen (NORCO/VICODIN) 5-325 MG per tablet 1-2 tablet  1-2 tablet Oral Q4H PRN Mcarthur Rossetti, MD   2 tablet at 11/07/17 0612  . menthol-cetylpyridinium (CEPACOL) lozenge 3 mg  1 lozenge Oral PRN Mcarthur Rossetti, MD       Or  . phenol (CHLORASEPTIC) mouth spray 1 spray  1 spray Mouth/Throat PRN Mcarthur Rossetti, MD      . methocarbamol (ROBAXIN) tablet 500 mg  500 mg Oral Q6H PRN Mcarthur Rossetti, MD       Or  . methocarbamol (ROBAXIN)  500 mg in dextrose 5 % 50 mL IVPB  500 mg Intravenous Q6H PRN Mcarthur Rossetti, MD   Stopped at 11/06/17 1751  . metoCLOPramide (REGLAN) tablet 5-10 mg  5-10 mg Oral Q8H PRN Mcarthur Rossetti, MD       Or  . metoCLOPramide (REGLAN) injection 5-10 mg  5-10 mg Intravenous Q8H PRN Mcarthur Rossetti, MD      . metoprolol tartrate (LOPRESSOR) tablet 25 mg  25 mg Oral BID Mcarthur Rossetti, MD   25 mg at 11/06/17 2156  . morphine 4 MG/ML injection 0.52-1 mg  0.52-1 mg Intravenous Q2H PRN Mcarthur Rossetti, MD      . multivitamin liquid 15 mL  15 mL Oral Daily Hongalgi, Anand D, MD      . ondansetron [facility]) tablet 4 mg  4 mg Oral Q6H PRN Mcarthur Rossetti, MD       Or  . ondansetron [facility]) injection 4 mg  4 mg Intravenous Q6H PRN Mcarthur Rossetti, MD      . pantoprazole (PROTONIX) EC tablet 40 mg  40 mg Oral BID Mcarthur Rossetti, MD   40 mg at 11/07/17 1044  . polyethylene glycol (MIRALAX / GLYCOLAX) packet 17 g  17 g Oral Daily PRN Mcarthur Rossetti, MD      . tamsulosin [facility]) capsule 0.4 mg  0.4 mg Oral Daily Mcarthur Rossetti, MD   0.4 mg at 11/07/17 1046     Discharge Medications: Please see discharge summary for a list of discharge medications.  Relevant Imaging Results:  Relevant Lab Results:   Additional Information SS# [id number]  Nila Nephew, LCSW

## 2017-11-07 NOTE — Clinical Social Work Note (Signed)
Clinical Social Work Assessment  Patient Details  Name: Andre Silva MRN: [id number] Date of Birth: [birth year]  Date of referral:  11/07/17               Reason for consult:  Facility Placement                Permission sought to share information with:  Family Supports Permission granted to share information::     Name::     girlfriend Dietitian::     Relationship::     Contact Information:     Housing/Transportation Living arrangements for the past 2 months:  Apartment Source of Information:  Patient Patient Interpreter Needed:  None Criminal Activity/Legal Involvement Pertinent to Current Situation/Hospitalization:  No - Comment as needed Significant Relationships:  Significant Other Lives with:  Significant Other Do you feel safe going back to the place where you live?  Yes Need for family participation in patient care:  No (Coment)(however collateral information about assistance at home would be helpful)  Care giving concerns:  Pt admitted from home where he states he resides with his girlfriend. States he fell at home, however CSW unable to decipher pt's description of events leading to fall. Pt is veteran and has PCP at [facility]. Admitted for hip fracture, currently 1 day post-op.   Social Worker assessment / plan:  CSW consulted to assist with disposition plan. Consult noted "possible homeless," CSW inquired of pt and he reports he lives with his girlfriend. CSW attempted to call girlfriend for collateral information however unable to reach at number in chart.  Discussed DC plans with pt and he reports "I'd just as well go home." CSW inquired as to willingness to pursue short term SNF if recommended, and he states, "It doesn't matter to me." Pt difficult to assess due to speech impediment and lack of family able to speak with CSW at time of interaction. [place] in order to begin [place] SNF placement process and was told by AOD that no referrals are  processed over the weekend, advised call Monday or during normal business hours to begin process. Will complete FL2.  Plan: TBD- SNF ([facility]) versus home pending options and collateral information about home supports.   Employment status:  Retired Forensic scientist:  IT sales professional PT Recommendations:  [place], Home with [place] / Referral to community resources:     Patient/Family's Response to care:  Pt appreciative of care  Patient/Family's Understanding of and Emotional Response to Diagnosis, Current Treatment, and Prognosis:  Pt demonstrated knowledge of place, situation (e.i., being in hospital post surgery), and potential plans however was difficult to assess (discussed above) and emotionally did not seem invested in conversation. "It makes no difference to me."  Emotional Assessment Appearance:  Appears stated age Attitude/Demeanor/Rapport:  (appropriate) Affect (typically observed):  Flat, Calm Orientation:  Oriented to Self, Oriented to Place, Oriented to  Time, Oriented to Situation Alcohol / Substance use:  Not Applicable Psych involvement (Current and /or in the community):  No (Comment)  Discharge Needs  Concerns to be addressed:  Decision making concerns, Discharge Planning Concerns Readmission within the last 30 days:  No Current discharge risk:  (assessing) Barriers to Discharge:  Continued Medical Work up([facility] referral )   Nila Nephew, LCSW 11/07/2017, 1:13 PM  [phone] weekend coverage

## 2017-11-07 NOTE — Evaluation (Signed)
Clinical/Bedside Swallow Evaluation Patient Details  Name: Andre Silva MRN: [id number] Date of Birth: 28-Apr-1935  Today's Date: 11/07/2017 Time: SLP Start Time (ACUTE ONLY): 2952 SLP Stop Time (ACUTE ONLY): 1424 SLP Time Calculation (min) (ACUTE ONLY): 17 min  Past Medical History:  Past Medical History:  Diagnosis Date  . Absent sense of smell   . Corns and callosities   . Hypertension   . Intermittent cerebral ischemia   . Knee joint pain   . Low back pain   . Lung cancer ([place])   . Problems with swallowing and mastication   . Skin cyst   . Unspecified urinary incontinence    Past Surgical History:  Past Surgical History:  Procedure Laterality Date  . LOBECTOMY Right 04/15/2015   RUL lobectomy   HPI:  Andre Silva is an 83 year old male with PMH of NSCLC RUL status post RUL lobectomy 2016, NSCLC left lower lobe last seen by radiation oncology on 10/26/2016 and appears to have been lost to follow-up, hoarseness/loss of olfactory and taste, HTN, HLD, sustained mechanical fall on 5/17 without LOC and presented with severe left hip pain and admitted for left hip fracture. Orthopedics consulted and patient underwent left total hip arthroplasty anterior approach on 5/18. Per chart review Andre Silva has described trouble swallowing as far back as March 2015, at which time he said food got "stuck" and he sometimes had trouble managing his secretions.   Assessment / Plan / Recommendation Clinical Impression  Andre Silva has a rough vocal quality and dysarthric speech, with audible wet vocal quality at baseline. He repeatedly tries to cough and swallow, with audible swallows and subjectively weak cough. He was not able to clear his vocal quality despite Mod cues for increased force with coughing. During oral cavity assessment, frothy secretions are seen in his posterior oral cavity/pharynx. SLP provided limited PO trials including ice and small cup sip of water to facilitate clearance, but this results in multiple,  audible swallows, explosive cough response, and Andre Silva gasping for air. Recommend that he remain NPO for now pending MBS - liekly to be completed on next date. MD and RN aware of recommendations. Also discussed with MD concern regarding changes in voice, speech, and swallowing that could be concerning for a potential neurological cause, although timeline for this changes is unknown as Andre Silva is not a clear historian. Per discussion with MD, plan will be to complete MBS first to better assess oropharyngeal function before additional medical work up is considered. SLP Visit Diagnosis: Dysphagia, unspecified (R13.10)    Aspiration Risk  Severe aspiration risk;Risk for inadequate nutrition/hydration    Diet Recommendation NPO   Medication Administration: Via alternative means    Other  Recommendations Oral Care Recommendations: Oral care QID Other Recommendations: Have oral suction available   Follow up Recommendations (tba)      Frequency and Duration            Prognosis Prognosis for Safe Diet Advancement: Guarded Barriers to Reach Goals: Severity of deficits      Swallow Study   General HPI: Andre Silva is an 83 year old male with PMH of NSCLC RUL status post RUL lobectomy 2016, NSCLC left lower lobe last seen by radiation oncology on 10/26/2016 and appears to have been lost to follow-up, hoarseness/loss of olfactory and taste, HTN, HLD, sustained mechanical fall on 5/17 without LOC and presented with severe left hip pain and admitted for left hip fracture. Orthopedics consulted and patient underwent left total hip arthroplasty anterior approach on 5/18. Per chart  review Andre Silva has described trouble swallowing as far back as March 2015, at which time he said food got "stuck" and he soemtimes had trouble managing his secretions. Type of Study: Bedside Swallow Evaluation Previous Swallow Assessment: none in chart Diet Prior to this Study: Dysphagia 3 (soft);Thin liquids Temperature Spikes Noted:  No Respiratory Status: Room air History of Recent Intubation: No Behavior/Cognition: Alert;Cooperative;Requires cueing Oral Cavity Assessment: Excessive secretions Oral Care Completed by SLP: No Oral Cavity - Dentition: Edentulous Vision: Functional for self-feeding Self-Feeding Abilities: Able to feed self Patient Positioning: Upright in bed Baseline Vocal Quality: Wet;Other (comment)(rough) Volitional Cough: Weak;Wet Volitional Swallow: Able to elicit    Oral/Motor/Sensory Function Overall Oral Motor/Sensory Function: Within functional limits   Ice Chips Ice chips: Impaired Presentation: Spoon Pharyngeal Phase Impairments: Multiple swallows;Cough - Immediate   Thin Liquid Thin Liquid: Impaired Presentation: Cup;Self Fed Pharyngeal  Phase Impairments: Multiple swallows;Cough - Immediate    Nectar Thick Nectar Thick Liquid: Not tested   Honey Thick Honey Thick Liquid: Not tested   Puree Puree: Not tested   Solid   GO            Germain Osgood 11/07/2017,2:49 PM   Germain Osgood, M.A. CCC-SLP [phone]

## 2017-11-07 NOTE — Progress Notes (Signed)
Obtained page # for speech therapy from radiology. First page no response. Will try again. Dezmen Alcock, [employer]

## 2017-11-08 ENCOUNTER — Ambulatory Visit
Admit: 2017-11-08 | Discharge: 2017-11-08 | Disposition: A | Discharge status: Home / Self Care | Payer: Non-veteran care | Attending: Radiation Oncology | Admitting: Radiation Oncology

## 2017-11-08 ENCOUNTER — Inpatient Hospital Stay (HOSPITAL_COMMUNITY): Payer: Medicare PPO

## 2017-11-08 ENCOUNTER — Encounter (HOSPITAL_COMMUNITY): Payer: Self-pay | Admitting: Orthopaedic Surgery

## 2017-11-08 DIAGNOSIS — R1310 Dysphagia, unspecified: Secondary | ICD-10-CM

## 2017-11-08 DIAGNOSIS — R509 Fever, unspecified: Secondary | ICD-10-CM

## 2017-11-08 DIAGNOSIS — C3432 Malignant neoplasm of lower lobe, left bronchus or lung: Secondary | ICD-10-CM

## 2017-11-08 DIAGNOSIS — C3411 Malignant neoplasm of upper lobe, right bronchus or lung: Secondary | ICD-10-CM

## 2017-11-08 LAB — CBC
HCT: 24.5 % — ABNORMAL LOW (ref 39.0–52.0)
Hemoglobin: 8.1 g/dL — ABNORMAL LOW (ref 13.0–17.0)
MCH: 29 pg (ref 26.0–34.0)
MCHC: 33.1 g/dL (ref 30.0–36.0)
MCV: 87.8 fL (ref 78.0–100.0)
PLATELETS: 170 10*3/uL (ref 150–400)
RBC: 2.79 MIL/uL — ABNORMAL LOW (ref 4.22–5.81)
RDW: 18.8 % — AB (ref 11.5–15.5)
WBC: 6.5 10*3/uL (ref 4.0–10.5)

## 2017-11-08 LAB — BASIC METABOLIC PANEL
Anion gap: 9 (ref 5–15)
BUN: 30 mg/dL — ABNORMAL HIGH (ref 6–20)
CALCIUM: 8.8 mg/dL — AB (ref 8.9–10.3)
CO2: 27 mmol/L (ref 22–32)
Chloride: 105 mmol/L (ref 101–111)
Creatinine, Ser: 1.12 mg/dL (ref 0.61–1.24)
GFR calc Af Amer: >60 mL/min (ref >60)
GLUCOSE: 121 mg/dL — AB (ref 65–99)
POTASSIUM: 3.5 mmol/L (ref 3.5–5.1)
Sodium: 141 mmol/L (ref 135–145)

## 2017-11-08 MED ORDER — FAMOTIDINE 20 MG PO TABS
40.0000 mg | ORAL_TABLET | Freq: Every day | ORAL | Status: DC
  Administered 2017-11-08: 40 mg via ORAL
  Filled 2017-11-08: qty 2

## 2017-11-08 MED ORDER — LORATADINE 10 MG PO TABS
10.0000 mg | ORAL_TABLET | Freq: Every day | ORAL | Status: DC
  Administered 2017-11-08 – 2017-11-09 (×2): 10 mg via ORAL
  Filled 2017-11-08 (×2): qty 1

## 2017-11-08 MED ORDER — ATORVASTATIN CALCIUM 40 MG PO TABS
40.0000 mg | ORAL_TABLET | Freq: Every day | ORAL | Status: DC
  Administered 2017-11-08: 40 mg via ORAL
  Filled 2017-11-08: qty 1

## 2017-11-08 MED ORDER — TAMSULOSIN HCL 0.4 MG PO CAPS
0.4000 mg | ORAL_CAPSULE | Freq: Every day | ORAL | Status: DC
  Administered 2017-11-08 – 2017-11-09 (×2): 0.4 mg via ORAL
  Filled 2017-11-08 (×2): qty 1

## 2017-11-08 MED ORDER — ASPIRIN 325 MG PO TABS: 325.0000 mg | ORAL_TABLET | Freq: Every day | ORAL | Status: DC

## 2017-11-08 MED ORDER — PANTOPRAZOLE SODIUM 40 MG PO TBEC
40.0000 mg | DELAYED_RELEASE_TABLET | Freq: Two times a day (BID) | ORAL | Status: DC
  Administered 2017-11-08 – 2017-11-09 (×3): 40 mg via ORAL
  Filled 2017-11-08 (×3): qty 1

## 2017-11-08 MED ORDER — SODIUM CHLORIDE 0.9 % IV SOLN
INTRAVENOUS | Status: AC
  Administered 2017-11-08: 22:00:00 via INTRAVENOUS

## 2017-11-08 NOTE — Progress Notes (Signed)
Subjective: 2 Days Post-Op Procedure(s) (LRB): TOTAL HIP ARTHROPLASTY ANTERIOR APPROACH (Left) Patient reports pain as moderate.  Acute blood loss anemia from his fracture and surgery.  Objective: Vital signs in last 24 hours: Temp:  [98.3 F (36.8 C)-102.7 F (39.3 C)] 99.4 F (37.4 C) (05/20 0443) Pulse Rate:  [95-136] 117 (05/20 0443) Resp:  [12-15] 12 (05/20 0443) BP: (83-109)/(50-71) 109/65 (05/20 0443) SpO2:  [94 %-99 %] 99 % (05/20 0443)  Intake/Output from previous day: 05/19 0701 - 05/20 0700 In: 310 [P.O.:60; I.V.:250] Out: 400 [Urine:400] Intake/Output this shift: No intake/output data recorded.  Recent Labs    11/05/17 1640 11/07/17 0457 11/08/17 0531  HGB 11.2* 9.4* 8.1*   Recent Labs    11/07/17 0457 11/08/17 0531  WBC 5.5 6.5  RBC 3.28* 2.79*  HCT 29.2* 24.5*  PLT 172 170   Recent Labs    11/07/17 0457 11/08/17 0531  NA 139 141  K 3.9 3.5  CL 100* 105  CO2 27 27  BUN 19 30*  CREATININE 0.92 1.12  GLUCOSE 111* 121*  CALCIUM 8.8* 8.8*   No results for input(s): LABPT, INR in the last 72 hours.  Sensation intact distally Intact pulses distally Dorsiflexion/Plantar flexion intact Incision: scant drainage   Assessment/Plan: 2 Days Post-Op Procedure(s) (LRB): TOTAL HIP ARTHROPLASTY ANTERIOR APPROACH (Left) Up with therapy  May need a transfusion of one unit of PRBS May need short-term skilled nursing    Mcarthur Rossetti 11/08/2017, 7:11 AM

## 2017-11-08 NOTE — Progress Notes (Signed)
CSW called phone number : [phone] and patient GF answered. She is currently riding the bus on her way to hospital. CSW will discuss discharge plan with patient and GF when she arrives.   Kathrin Greathouse, Latanya Presser, MSW Clinical Social Worker  [phone] 11/08/2017  9:56 AM

## 2017-11-08 NOTE — Progress Notes (Addendum)
Modified Barium Swallow Progress Note  Patient Details  Name: Andre Silva MRN: [id number] Date of Birth: 1935-02-06  Today's Date: 11/08/2017  Modified Barium Swallow completed.  Full report located under Chart Review in the Imaging Section.  Brief recommendations include the following:  Clinical Impression  Patient presents with moderately severe oropharyngeal dysphagia with both sensorimotor and mechanical deficits.  Technically challenging MBS due to pt's positioning *shoulder blocking larynx*.  Decreased oral coordination results in boluses spilling into pharynx poorly controlled.   Appearance of cervical osteophytes/anterior curvature of cervical spine prevent adequate epiglottic deflection and thus results in vallecular residuals across all consistencies.  Pt with laryngeal penetration of all liquid consistencies tested (nectar, thin) - before, during and after the swallow at various times due to decreased epiglottic deflection and decreased airway closure. He did sense large amount of aspiration which resulted in delayed cough response that was ineffective to clear aspirates.  Moderate amounts of residuals noted in pharynx which multiple reflexive swallows helpful to decrease but not fully clear.  Chin tuck, head turn right/left did not improve swallow consistently.  Chin tuck worsened and allowed aspiration of thin from residuals spilling into open airway.  Suspect pt has chronic dysphagia with possible low grade aspiration that he manages.  He will remain at risk, especially when he becomes acutely ill and deconditioned.  Recommend continue diet with strict aspiration precautions.  Of note, pt with poor understanding to his dysphagia despite extensive education during MBS.  This may negatively impact his compliance with strategies.  SlP to follow up for dysphagia management.   Pt does demonstrates concerns for vagus nerve involvement including difficulty with managing secretions and  palatal deviation to right upon phonation - ? If possible recurrence of NSCLC may be causing vagus nerve impingement- pt unable to inform this SlP how long his voice has been hoarse or length of time of dysphagia.   Of note, pt had MRI 09/2016 per imaging section but no results are available in Epic.      Swallow Evaluation Recommendations       SLP Diet Recommendations: Dysphagia 3 (Mech soft) solids;No mixed consistencies;Thin liquid TO MITIGATE ASPIRATION-NOT PREVENT IT   Liquid Administration via: Cup;Straw   Medication Administration: Crushed with puree(if large)   Supervision: Patient able to self feed;Full assist for feeding   Compensations: Slow rate;Small sips/bites;Multiple dry swallows after each bite/sip;Clear throat intermittently   Postural Changes: Remain semi-upright after after feeds/meals (Comment);Seated upright at 90 degrees   Oral Care Recommendations: Oral care QID   Other Recommendations: Have oral suction available   Luanna Salk, [facility] SLP [phone]   Macario Golds 11/08/2017,9:34 AM

## 2017-11-08 NOTE — Progress Notes (Signed)
PROGRESS NOTE   Andre Silva  QMG:[id number]    DOB: [birth year]    DOA: 11/05/2017  PCP: [facility]   I have briefly reviewed patients previous medical records in [facility].  Brief Narrative:  83 year old male with PMH of NSCLC RUL status post RUL lobectomy 2016, NSCLC left lower lobe last seen by radiation oncology on 10/26/2016 and appears to have been lost to follow-up, hoarseness/loss of olfactory and taste, HTN, HLD, sustained mechanical fall on 5/17 without LOC and presented with severe left hip pain and admitted for left hip fracture.  Orthopedics consulted and patient underwent left total hip arthroplasty anterior approach on 5/18.   Assessment & Plan:   Principal Problem:   Closed left hip fracture, initial encounter ([place]) Active Problems:   HTN (hypertension)   Primary cancer of right upper lobe of lung ([place])   Presumed primary cancer of left lower lobe of lung ([place])   HLD (hyperlipidemia)   Protein-calorie malnutrition, severe   Left femoral neck fracture: Orthopedics/Dr. Ninfa Linden consulted and patient underwent left total hip arthroplasty anterior approach on 5/18.  Management per orthopedics.  PT evaluation appreciated and recommend SNF.  I discussed with Dr. Ninfa Linden on 5/20 who indicated that at surgery, patient's fracture did not appear like a pathological fracture but seem more like usual traumatic fracture.  Lung cancer: History as indicated above.  PET scan 12/01/2016: Posterior LLL solid 7 mm pulmonary nodule with low-level metabolism, stable since 10/15/2016, suspicious for a metachronous primary bronchogenic carcinoma.  Cannot exclude nodal metastasis to subcarinal lymph node.  Admitting MD mentions 8 mm irregular nodule on CT 02/17/2017 that was suspicious but I am unable to find that report.  CT chest 5/17: Interval increase in size of LLL pulmonary spiculated lesion, seems to be have doubled in size.  Additional spiculated focus in left lower lobe.   Concern for recurrent lung cancer.  I have communicated with radiation and medical oncology who are aware of this patient and will coordinate cancer care.  Essential hypertension: Soft blood pressures and hence home dose of amlodipine and metoprolol on hold since 5/19.  Monitor and resume as needed.  Hyperlipidemia: Continue statins.  Acute blood loss anemia: Related to fracture and surgical blood loss.  Hemoglobin dropped from 11.2 preop >9.4 > 8.1.  Follow CBC in a.m. and transfuse if hemoglobin <7 g per DL.  Tobacco abuse: Cessation counseled.  Not on home oxygen.  COPD: Seen on imaging studies.  No clinical bronchospasm.  Monitor.  Aortic atherosclerosis/CAD: Seen on CT chest.  No chest pain.  Hoarseness/dysphagia: Unclear etiology.?  Related to cancer or prior treatment.  Oncologist were attempting to get previous records as outpatient.  Outpatient follow-up.  Patient underwent evaluation by speech therapist including MBS and they have initiated him on a dysphagia 3 diet and thin liquids.  Some concern if his current lung cancer versus prior treatment are responsible for his dysphagia i.e. recurrent laryngeal nerve palsy, communicated with oncology.  Fever: ?  Related to postop surgical site intervention.  Chest x-ray without pneumonia.  For now monitor with antipyretics.  If has recurrent fevers, will evaluate further and consider antibiotics.  Brief IV fluids due to insensible loss and inconsistent/poor oral intake.  Sinus tachycardia likely related to fever.   DVT prophylaxis: SCDs and aspirin. Code Status: Full Family Communication: None at bedside today.. Disposition: DC to SNF pending clinical improvement, no further fevers.   Consultants:  Orthopedics/Dr. Ninfa Linden  Procedures:  Left total  hip arthroplasty anterior approach on 5/18  Antimicrobials:  None   Subjective: Patient is a poor historian.  Denies complaints.  Denies dyspnea.  Does not appear as congested.  As  per nursing, no acute issues reported.  ROS: As above.  Objective:  Vitals:   11/07/17 2025 11/08/17 0143 11/08/17 0443 11/08/17 1100  BP: (!) 99/50 (!) 83/55 109/65   Pulse: (!) 136 (!) 121 (!) 117   Resp: 14 14 12    Temp: (!) 102.7 F (39.3 C) 99.6 F (37.6 C) 99.4 F (37.4 C)   TempSrc: Oral Oral Oral   SpO2: 98% 97% 99%   Weight:    47 kg (103 lb 9.9 oz)  Height:    5\' 8"  (1.727 m)    Examination:  General exam: Elderly male, moderately built, frail, cachectic and chronically ill looking lying comfortably propped up in bed. Respiratory system: Clear to auscultation without wheezing, rhonchi or crackles.  No increased work of breathing. Cardiovascular system: S1 & S2 heard, regular mild tachycardia. No JVD, murmurs, rubs, gallops or clicks. No pedal edema.  Stable. Gastrointestinal system: Abdomen is nondistended, soft and nontender. No organomegaly or masses felt. Normal bowel sounds heard.  Stable. Central nervous system: Alert and oriented x2. No focal neurological deficits.  Hoarseness + + and difficult to understand speech.  Stable without change Extremities: Symmetric 5 x 5 power except left lower extremity with movements restricted due to pain.  Left hip surgical site dressing clean and dry.  Stable without change Skin: No rashes, lesions or ulcers Psychiatry: Judgement and insight appear impaired. Mood & affect appropriate.     Data Reviewed: I have personally reviewed following labs and imaging studies  CBC: Recent Labs  Lab 11/05/17 1640 11/07/17 0457 11/08/17 0531  WBC 7.7 5.5 6.5  NEUTROABS 6.4  --   --   HGB 11.2* 9.4* 8.1*  HCT 34.8* 29.2* 24.5*  MCV 89.7 89.0 87.8  PLT 180 172 242   Basic Metabolic Panel: Recent Labs  Lab 11/05/17 1640 11/07/17 0457 11/08/17 0531  NA 144 139 141  K 3.6 3.9 3.5  CL 106 100* 105  CO2 27 27 27   GLUCOSE 116* 111* 121*  BUN 14 19 30*  CREATININE 0.75 0.92 1.12  CALCIUM 9.2 8.8* 8.8*   Liver Function  Tests: Recent Labs  Lab 11/05/17 1640  AST 19  ALT 14*  ALKPHOS 52  BILITOT 0.8  PROT 7.3  ALBUMIN 3.9     Recent Results (from the past 240 hour(s))  Surgical pcr screen     Status: None   Collection Time: 11/06/17 12:16 AM  Result Value Ref Range Status   MRSA, PCR NEGATIVE NEGATIVE Final   Staphylococcus aureus NEGATIVE NEGATIVE Final    Comment: (NOTE) The Xpert SA Assay (FDA approved for NASAL specimens in patients 49 years of age and older), is one component of a comprehensive surveillance program. It is not intended to diagnose infection nor to guide or monitor treatment. Performed at [facility], [place] [address], [place], [place] [address]          Radiology Studies: Dg Chest 2 View  Result Date: 11/08/2017 CLINICAL DATA:  Fever.  Rule out aspiration pneumonia EXAM: CHEST - 2 VIEW COMPARISON:  Three days ago FINDINGS: Large lung volumes. Normal heart size and aortic contours. Postoperative right hilum. There is no edema, consolidation, effusion, or pneumothorax. IMPRESSION: 1. No evidence of active disease. 2. COPD. 3. Postoperative chest on the right.  Reference chest CT 11/05/2017 concerning pulmonary nodules. Electronically Signed   By: Monte Fantasia M.D.   On: 11/08/2017 09:08   Dg Swallowing Func-speech Pathology  Result Date: 11/08/2017 Objective Swallowing Evaluation: Type of Study: MBS-Modified Barium Swallow Study  Patient Details Name: Andre Silva MRN: [id number] Date of Birth: 02/03/1935 Today's Date: 11/08/2017 Time: SLP Start Time (ACUTE ONLY): 0830 -SLP Stop Time (ACUTE ONLY): 0901 SLP Time Calculation (min) (ACUTE ONLY): 31 min Past Medical History: Past Medical History: Diagnosis Date . Absent sense of smell  . Corns and callosities  . Hypertension  . Intermittent cerebral ischemia  . Knee joint pain  . Low back pain  . Lung cancer ([place])  . Problems with swallowing and mastication  . Skin cyst  . Unspecified urinary incontinence   Past Surgical History: Past Surgical History: Procedure Laterality Date . LOBECTOMY Right 04/15/2015  RUL lobectomy HPI: Pt is an 83 year old male with PMH of NSCLC RUL status post RUL lobectomy 2016, NSCLC left lower lobe last seen by radiation oncology on 10/26/2016 and appears to have been lost to follow-up, hoarseness/loss of olfactory and taste, HTN, HLD, sustained mechanical fall on 5/17 without LOC and presented with severe left hip pain and admitted for left hip fracture. Orthopedics consulted and patient underwent left total hip arthroplasty anterior approach on 5/18. Per chart review pt has described trouble swallowing as far back as March 2015, at which time he said food got "stuck" and he soemtimes had trouble managing his secretions.Pt underwent BSE yesterday and was made npo with plan for MBS today.  Subjective: pt with poor understanding to purpose of exam despite education to concerns for swallowing deficits Assessment / Plan / Recommendation CHL IP CLINICAL IMPRESSIONS 11/08/2017 Clinical Impression Patient presents with moderately severe oropharyngeal dysphagia with both sensorimotor and mechanical deficits.  Technically challenging MBS due to pt's positioning *shoulder blocking larynx*.  Decreased oral coordination results in boluses spilling into pharynx poorly controlled.   Appearance of cervical osteophytes/anterior curvature of cervical spine prevent adequate epiglottic deflection and thus results in vallecular residuals across all consistencies.  Pt with laryngeal penetration of all liquid consistencies tested (nectar, thin) - before, during and after the swallow at various times due to decreased epiglottic deflection and decreased airway closure. He did sense large amount of aspiration which resulted in delayed cough response that was ineffective to clear aspirates.  Moderate amounts of residuals noted in pharynx which multiple reflexive swallows helpful to decrease but not fully clear.  Chin  tuck, head turn right/left did not improve swallow consistently.  Chin tuck worsened and allowed aspiration of thin from residuals spilling into open airway.  Suspect pt has chronic dysphagia with possible low grade aspiration that he manages.  He will remain at risk, especially when he becomes acutely ill and deconditioned.  Recommend continue diet with strict aspiration precautions.  Of note, pt with poor understanding to his dysphagia despite extensive education during MBS.  This may negatively impact his compliance with strategies.  SlP to follow up for dysphagia management. SLP Visit Diagnosis Dysphagia, oropharyngeal phase (R13.12) Attention and concentration deficit following -- Frontal lobe and executive function deficit following -- Impact on safety and function Moderate aspiration risk   CHL IP TREATMENT RECOMMENDATION 11/07/2017 Treatment Recommendations Defer until completion of intrumental exam   Prognosis 11/08/2017 Prognosis for Safe Diet Advancement Guarded Barriers to Reach Goals Cognitive deficits;Motivation;Severity of deficits Barriers/Prognosis Comment -- CHL IP DIET RECOMMENDATION 11/08/2017 SLP Diet Recommendations Dysphagia 3 (Mech soft) solids;No  mixed consistencies;Thin liquid- TO MITIGATE ASPIRATION-NOT PREVENT IT Liquid Administration via Cup;Straw Medication Administration Crushed with puree Compensations Slow rate;Small sips/bites;Multiple dry swallows after each bite/sip;Clear throat intermittently Postural Changes Remain semi-upright after after feeds/meals (Comment);Seated upright at 90 degrees   CHL IP OTHER RECOMMENDATIONS 11/08/2017 Recommended Consults -- Oral Care Recommendations Oral care QID Other Recommendations Have oral suction available   CHL IP FOLLOW UP RECOMMENDATIONS 11/07/2017 Follow up Recommendations (No Data)   No flowsheet data found.     CHL IP ORAL PHASE 11/08/2017 Oral Phase Impaired Oral - Pudding Teaspoon -- Oral - Pudding Cup -- Oral - Honey Teaspoon -- Oral -  Honey Cup -- Oral - Nectar Teaspoon Weak lingual manipulation;Decreased bolus cohesion;Premature spillage Oral - Nectar Cup Weak lingual manipulation;Decreased bolus cohesion;Premature spillage Oral - Nectar Straw -- Oral - Thin Teaspoon Weak lingual manipulation;Decreased bolus cohesion;Premature spillage Oral - Thin Cup Weak lingual manipulation;Lingual pumping;Decreased bolus cohesion;Premature spillage Oral - Thin Straw Weak lingual manipulation;Lingual pumping;Decreased bolus cohesion;Premature spillage Oral - Puree Weak lingual manipulation;Decreased bolus cohesion;Premature spillage Oral - Mech Soft -- Oral - Regular Impaired mastication;Weak lingual manipulation;Premature spillage Oral - Multi-Consistency -- Oral - Pill -- Oral Phase - Comment --  CHL IP PHARYNGEAL PHASE 11/08/2017 Pharyngeal Phase Impaired Pharyngeal- Pudding Teaspoon -- Pharyngeal -- Pharyngeal- Pudding Cup -- Pharyngeal -- Pharyngeal- Honey Teaspoon -- Pharyngeal -- Pharyngeal- Honey Cup -- Pharyngeal -- Pharyngeal- Nectar Teaspoon Reduced pharyngeal peristalsis;Reduced epiglottic inversion;Reduced laryngeal elevation;Reduced airway/laryngeal closure;Reduced tongue base retraction;Penetration/Aspiration during swallow;Pharyngeal residue - valleculae;Pharyngeal residue - pyriform;Lateral channel residue Pharyngeal Material enters airway, remains ABOVE vocal cords then ejected out Pharyngeal- Nectar Cup Reduced epiglottic inversion;Reduced anterior laryngeal mobility;Reduced laryngeal elevation;Reduced airway/laryngeal closure;Reduced tongue base retraction;Penetration/Aspiration during swallow;Penetration/Apiration after swallow;Pharyngeal residue - valleculae;Pharyngeal residue - pyriform;Lateral channel residue Pharyngeal Material enters airway, CONTACTS cords and then ejected out Pharyngeal- Nectar Straw Reduced pharyngeal peristalsis;Reduced epiglottic inversion;Reduced laryngeal elevation;Reduced airway/laryngeal closure;Reduced tongue  base retraction;Penetration/Aspiration during swallow;Penetration/Apiration after swallow;Pharyngeal residue - valleculae;Pharyngeal residue - pyriform;Lateral channel residue Pharyngeal Material enters airway, CONTACTS cords and then ejected out Pharyngeal- Thin Teaspoon Reduced pharyngeal peristalsis;Reduced epiglottic inversion;Reduced laryngeal elevation;Reduced airway/laryngeal closure;Reduced tongue base retraction;Penetration/Aspiration before swallow;Pharyngeal residue - valleculae;Pharyngeal residue - pyriform;Pharyngeal residue - cp segment;Lateral channel residue Pharyngeal Material enters airway, remains ABOVE vocal cords and not ejected out Pharyngeal- Thin Cup Reduced pharyngeal peristalsis;Reduced epiglottic inversion;Reduced laryngeal elevation;Reduced airway/laryngeal closure;Reduced tongue base retraction;Penetration/Aspiration before swallow;Penetration/Aspiration during swallow;Penetration/Apiration after swallow;Pharyngeal residue - valleculae;Pharyngeal residue - pyriform;Lateral channel residue Pharyngeal Material enters airway, CONTACTS cords and not ejected out;Material enters airway, passes BELOW cords then ejected out Pharyngeal- Thin Straw Reduced epiglottic inversion;Reduced laryngeal elevation;Reduced airway/laryngeal closure;Reduced tongue base retraction;Penetration/Aspiration during swallow;Penetration/Apiration after swallow;Moderate aspiration;Pharyngeal residue - valleculae;Pharyngeal residue - pyriform;Lateral channel residue Pharyngeal Material enters airway, passes BELOW cords and not ejected out despite cough attempt by patient Pharyngeal- Puree Reduced pharyngeal peristalsis;Reduced epiglottic inversion;Reduced laryngeal elevation;Reduced tongue base retraction;Pharyngeal residue - valleculae Pharyngeal -- Pharyngeal- Mechanical Soft -- Pharyngeal -- Pharyngeal- Regular Reduced pharyngeal peristalsis;Reduced epiglottic inversion;Reduced laryngeal elevation;Pharyngeal residue  - valleculae Pharyngeal -- Pharyngeal- Multi-consistency -- Pharyngeal -- Pharyngeal- Pill -- Pharyngeal -- Pharyngeal Comment head turn right/left nor chin tuck helpful to decrease residuals, chin tuck worsened airway protection with thin as it spilled residuals into airway, multiple swallows helpful to decrease residuals howevever, cued "hock" did not clear vallecular residuals - but advised pt to continue to work to strengthen this strategy  CHL IP CERVICAL ESOPHAGEAL PHASE 11/08/2017 Cervical Esophageal Phase WFL Pudding Teaspoon -- Pudding Cup -- Honey Teaspoon --  Honey Cup -- Nectar Teaspoon -- Nectar Cup -- Nectar Straw -- Thin Teaspoon -- Thin Cup -- Thin Straw -- Puree -- Mechanical Soft -- Regular -- Multi-consistency -- Pill -- Cervical Esophageal Comment -- No flowsheet data found. Luanna Salk, MS [facility] SLP [phone]                   Scheduled Meds: . [START ON 11/09/2017] aspirin  325 mg Oral Daily  . atorvastatin  40 mg Oral QHS  . famotidine  40 mg Oral QHS  . loratadine  10 mg Oral Daily  . pantoprazole  40 mg Oral BID  . tamsulosin  0.4 mg Oral Daily   Continuous Infusions: . sodium chloride Stopped (11/08/17 0800)     LOS: 3 days     Vernell Leep, MD, FACP, [facility]. [facility] Pager [phone]  If 7PM-7AM, please contact night-coverage [email or web address] Password [email or web address] 11/08/2017, 1:08 PM

## 2017-11-08 NOTE — Consult Note (Signed)
Radiation Oncology         [phone]) [phone] ________________________________  Name: Andre Silva        MRN: [id number]  Date of Service: 11/08/17              DOB: 01-25-1935  CH:ENIDPO, [place]  No ref. provider found     REFERRING PHYSICIAN: No ref. provider found   DIAGNOSIS: The primary encounter diagnosis was Closed displaced fracture of left femoral neck ([place]). Diagnoses of Closed left hip fracture, initial encounter [facility]), Surgery, elective, Status post total replacement of left hip, and Fever were also pertinent to this visit.   HISTORY OF PRESENT ILLNESS: Andre Silva is a 83 y.o. male seen at the request of Dr. Lindaann Pascal for discussion of treatment to his LLL lesion. Mr. Pizano underwent a right upper lung lobectomy on 04/15/2015 at the [facility] in [place], [place]. Pathology revealed two separate synchronous primaries, both measuring 1.6 cm. 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 0 out of 24 lymph nodes were negative for carcinoma. pT1a(2), pN0      The patient was followed serial imaging until December 2016 when he was NED. He was lost to follow up for close to 18 months. The patient underwent CT CAP on 10/15/2016 and this revealed a new 0.8 cm nodule in the left lower lobe with irregular margins suspected for malignancy, a lymph node along the medial aspect of the RUL pleura measuring 0.8 (previously measuring 0.6 cm), and emphysema with postsurgical changes of a right upper lobectomy. Possible adenopathy was noted in the left hilum and subcarinal region. The patient saw Dr. Domenica Fail at the [facility] on 10/19/16 to review the CT seen below:     The patient proceeded with PET scan on 12/01/16 revealing the 7 mm lesion to have an SUV of 2.2 and he had mildly hypermetabolic nonenlarged subcarinal node. He was /counseled on treatment to this site but was lost to follow up despite multiple attempts to reach him. He  presented on 11/05/17 with a fracture of the left hip. He underwent total joint replacement on 11/06/17 and this was not felt to be a pathologic fracture. He is seen today to discuss resuming discussion of treatment for his LLL lesion. In his hospitalization, he did have repeat CT Chest that revealed the lesion at 14 x 15 x 11 mm, and an additional 5 x 2 x 7 mm lesion in the LLL adjacent. He did not receive contrast but did not have any obvious disease in the lymph nodes.   PREVIOUS RADIATION THERAPY: No   PAST MEDICAL HISTORY:  Past Medical History:  Diagnosis Date  . Absent sense of smell   . Corns and callosities   . Hypertension   . Intermittent cerebral ischemia   . Knee joint pain   . Low back pain   . Lung cancer ([place])   . Problems with swallowing and mastication   . Skin cyst   . Unspecified urinary incontinence        PAST SURGICAL HISTORY: Past Surgical History:  Procedure Laterality Date  . LOBECTOMY Right 04/15/2015   RUL lobectomy  . TOTAL HIP ARTHROPLASTY Left 11/06/2017   Procedure: TOTAL HIP ARTHROPLASTY ANTERIOR APPROACH;  Surgeon: Mcarthur Rossetti, MD;  Location: [facility];  Service: Orthopedics;  Laterality: Left;     FAMILY HISTORY:  Family History  Problem Relation Age of Onset  . Breast cancer Mother  SOCIAL HISTORY:  reports that he has been smoking cigarettes.  He has a 17.00 pack-year smoking history. He has never used smokeless tobacco. He reports that he drinks about 3.6 oz of alcohol per week. He reports that he does not use drugs.   ALLERGIES: Patient has no known allergies.   MEDICATIONS:  Current Facility-Administered Medications  Medication Dose Route Frequency Provider Last Rate Last Dose  . 0.9 %  sodium chloride infusion   Intravenous Continuous Bodenheimer, Clenton Pare, NP   Stopped at 11/08/17 0800  . acetaminophen (TYLENOL) suppository 650 mg  650 mg Rectal Q6H PRN Modena Jansky, MD      . Derrill Memo ON 11/09/2017] aspirin  tablet 325 mg  325 mg Oral Daily Hongalgi, Anand D, MD      . atorvastatin (LIPITOR) tablet 40 mg  40 mg Oral QHS Hongalgi, Anand D, MD      . famotidine (PEPCID) tablet 40 mg  40 mg Oral QHS Hongalgi, Anand D, MD      . loratadine (CLARITIN) tablet 10 mg  10 mg Oral Daily Hongalgi, Anand D, MD      . morphine 4 MG/ML injection 0.52-1 mg  0.52-1 mg Intravenous Q2H PRN Modena Jansky, MD   1 mg at 11/07/17 2206  . ondansetron (ZOFRAN) injection 4 mg  4 mg Intravenous Q6H PRN Mcarthur Rossetti, MD      . pantoprazole (PROTONIX) EC tablet 40 mg  40 mg Oral BID Hongalgi, Anand D, MD      . phenol (CHLORASEPTIC) mouth spray 1 spray  1 spray Mouth/Throat PRN Mcarthur Rossetti, MD      . tamsulosin (FLOMAX) capsule 0.4 mg  0.4 mg Oral Daily Hongalgi, Lenis Dickinson, MD         REVIEW OF SYSTEMS: On review of systems, the patient reports that he is doing well overall. He continues to have hoarseness. He also has poorly fitting dentures. He denies any chest pain, shortness of breath, cough, fevers, chills, night sweats, unintended weight changes. He denies any bowel or bladder disturbances, and denies abdominal pain, nausea or vomiting. He denies any new musculoskeletal or joint aches or pains. A complete review of systems is obtained and is otherwise negative.     PHYSICAL EXAM:  Wt Readings from Last 3 Encounters:  11/08/17 103 lb 9.9 oz (47 kg)  10/26/16 110 lb (49.9 kg)  10/11/13 113 lb 1.6 oz (51.3 kg)   Temp Readings from Last 3 Encounters:  11/08/17 98.9 F (37.2 C) (Oral)  10/26/16 98.3 F (36.8 C) (Oral)  10/11/13 97.9 F (36.6 C) (Oral)   BP Readings from Last 3 Encounters:  11/08/17 96/60  10/26/16 125/70  10/11/13 116/70   Pulse Readings from Last 3 Encounters:  11/08/17 (!) 118  10/26/16 77  10/11/13 84   Pain Assessment Pain Score: 0-No pain/10  In general this is a thin, African American male in no acute distress. He is alert and oriented x4 and appropriate  throughout the examination. HEENT reveals that the patient is normocephalic, atraumatic. EOMs are intact. PERRLA. Skin is intact without any evidence of gross lesions. Cardiovascular exam reveals a regular rate and rhythm, no clicks rubs or murmurs are auscultated. Chest is clear to auscultation bilaterally. Lymphatic assessment is performed and does not reveal any adenopathy in the cervical, supraclavicular, axillary, or inguinal chains. Abdomen has scaphoid and has active bowel sounds in all quadrants and is intact. The abdomen is soft, non tender, non distended.  Lower extremities are negative for pretibial pitting edema, deep calf tenderness, cyanosis or clubbing. His left hip is dressed and undisturbed.    ECOG = 0  0 - Asymptomatic (Fully active, able to carry on all predisease activities without restriction)  1 - Symptomatic but completely ambulatory (Restricted in physically strenuous activity but ambulatory and able to carry out work of a light or sedentary nature. For example, light housework, office work)  2 - Symptomatic, <50% in bed during the day (Ambulatory and capable of all self care but unable to carry out any work activities. Up and about more than 50% of waking hours)  3 - Symptomatic, >50% in bed, but not bedbound (Capable of only limited self-care, confined to bed or chair 50% or more of waking hours)  4 - Bedbound (Completely disabled. Cannot carry on any self-care. Totally confined to bed or chair)  5 - Death   Eustace Pen MM, Silva RH, Tormey DC, et al. [phone]). "Toxicity and response criteria of the [facility]". [place] Oncol. 5 (6): 649-55    LABORATORY DATA:  Lab Results  Component Value Date   WBC 6.5 11/08/2017   HGB 8.1 (L) 11/08/2017   HCT 24.5 (L) 11/08/2017   MCV 87.8 11/08/2017   PLT 170 11/08/2017   Lab Results  Component Value Date   NA 141 11/08/2017   K 3.5 11/08/2017   CL 105 11/08/2017   CO2 27 11/08/2017   Lab Results   Component Value Date   ALT 14 (L) 11/05/2017   AST 19 11/05/2017   ALKPHOS 52 11/05/2017   BILITOT 0.8 11/05/2017      RADIOGRAPHY: Dg Chest 2 View  Result Date: 11/08/2017 CLINICAL DATA:  Fever.  Rule out aspiration pneumonia EXAM: CHEST - 2 VIEW COMPARISON:  Three days ago FINDINGS: Large lung volumes. Normal heart size and aortic contours. Postoperative right hilum. There is no edema, consolidation, effusion, or pneumothorax. IMPRESSION: 1. No evidence of active disease. 2. COPD. 3. Postoperative chest on the right. Reference chest CT 11/05/2017 concerning pulmonary nodules. Electronically Signed   By: Monte Fantasia M.D.   On: 11/08/2017 09:08   Dg Chest 2 View  Result Date: 11/05/2017 CLINICAL DATA:  Weakness.  Productive cough for 2 days. EXAM: CHEST - 2 VIEW COMPARISON:  09/04/2005. FINDINGS: Cardiac silhouette is normal in size. No mediastinal or hilar masses. There are surgical vascular clips projecting over the right hilum, new since the prior chest radiographs, stable from a PET-CT dated 12/01/2016. Lungs are hyperexpanded but clear. No pleural effusion or pneumothorax. Skeletal structures are demineralized. IMPRESSION: No acute cardiopulmonary disease. Hyperexpanded lungs consistent with COPD. Electronically Signed   By: Lajean Manes M.D.   On: 11/05/2017 17:30   Ct Chest Wo Contrast  Result Date: 11/05/2017 CLINICAL DATA:  83 year old male with history of right upper lobectomy for lung cancer demonstrating 2 synchronous primary bronchogenic carcinoma is, one adenocarcinoma and one squamous cell carcinoma with left lower lobe pulmonary nodule seen on PET-CT dated 12/01/2016. EXAM: CT CHEST WITHOUT CONTRAST TECHNIQUE: Multidetector CT imaging of the chest was performed following the standard protocol without IV contrast. COMPARISON:  12/01/2016 PET-CT FINDINGS: Cardiovascular: Heart size is normal. Left main and three-vessel coronary arteriosclerosis. No significant pericardial  effusion or pleural thickening. Nonaneurysmal atherosclerotic aorta. Unremarkable noncontrast appearance of the pulmonary vasculature. Mediastinum/Nodes: No pathologically enlarged lymph nodes within the mediastinum. Assessment for hilar lymphadenopathy is limited due to lack of IV contrast. Patent trachea and mainstem bronchi.  Unremarkable esophagus. Lungs/Pleura: Larger spiculated mass in the left lower lobe now measuring 14 x 15 x 11 mm versus 7 mm previously. Additional 5 x 2 x 7 mm spiculation seen in the left lower lobe. No effusion or pneumothorax. Paraseptal emphysema at the left lung apex. Right upper lobectomy postop change. Upper Abdomen: No acute abnormality. Musculoskeletal: No aggressive osseous lesions. IMPRESSION: 1. Interval increase in size of left lower lobe pulmonary spiculated lesion now measuring 14 x 15 x 11 mm versus 7 mm previously. Additional 5 x 2 x 7 mm spiculated focus in the left lower lobe abutting the diaphragmatic pleura. 2. Resolution of subpleural inflammatory change in the right lower lobe since prior PET-CT. 3. Aortic atherosclerosis with left main and three-vessel coronary arteriosclerosis. Aortic Atherosclerosis (ICD10-I70.0) and Emphysema (ICD10-J43.9). Electronically Signed   By: Ashley Royalty M.D.   On: 11/05/2017 21:17   Pelvis Portable  Result Date: 11/06/2017 CLINICAL DATA:  Left hip replacement surgery EXAM: PORTABLE PELVIS 1-2 VIEWS COMPARISON:  the previous day's study FINDINGS: Interval placement of left hip arthroplasty components, projecting in expected location. No fracture or dislocation. Lateral skin staples. Stable pelvic vascular clips. Patchy iliofemoral arterial calcifications. IMPRESSION: 1. Left hip arthroplasty without apparent complication. Electronically Signed   By: Lucrezia Europe M.D.   On: 11/06/2017 12:13   Dg Swallowing Func-speech Pathology  Result Date: 11/08/2017 Objective Swallowing Evaluation: Type of Study: MBS-Modified Barium Swallow Study   Patient Details Name: Andre Silva MRN: [id number] Date of Birth: October 07, 1934 Today's Date: 11/08/2017 Time: SLP Start Time (ACUTE ONLY): 0830 -SLP Stop Time (ACUTE ONLY): 0901 SLP Time Calculation (min) (ACUTE ONLY): 31 min Past Medical History: Past Medical History: Diagnosis Date . Absent sense of smell  . Corns and callosities  . Hypertension  . Intermittent cerebral ischemia  . Knee joint pain  . Low back pain  . Lung cancer ([place])  . Problems with swallowing and mastication  . Skin cyst  . Unspecified urinary incontinence  Past Surgical History: Past Surgical History: Procedure Laterality Date . LOBECTOMY Right 04/15/2015  RUL lobectomy HPI: Pt is an 83 year old male with PMH of NSCLC RUL status post RUL lobectomy 2016, NSCLC left lower lobe last seen by radiation oncology on 10/26/2016 and appears to have been lost to follow-up, hoarseness/loss of olfactory and taste, HTN, HLD, sustained mechanical fall on 5/17 without LOC and presented with severe left hip pain and admitted for left hip fracture. Orthopedics consulted and patient underwent left total hip arthroplasty anterior approach on 5/18. Per chart review pt has described trouble swallowing as far back as March 2015, at which time he said food got "stuck" and he soemtimes had trouble managing his secretions.Pt underwent BSE yesterday and was made npo with plan for MBS today.  Subjective: pt with poor understanding to purpose of exam despite education to concerns for swallowing deficits Assessment / Plan / Recommendation CHL IP CLINICAL IMPRESSIONS 11/08/2017 Clinical Impression Patient presents with moderately severe oropharyngeal dysphagia with both sensorimotor and mechanical deficits.  Technically challenging MBS due to pt's positioning *shoulder blocking larynx*.  Decreased oral coordination results in boluses spilling into pharynx poorly controlled.   Appearance of cervical osteophytes/anterior curvature of cervical spine prevent adequate epiglottic  deflection and thus results in vallecular residuals across all consistencies.  Pt with laryngeal penetration of all liquid consistencies tested (nectar, thin) - before, during and after the swallow at various times due to decreased epiglottic deflection and decreased airway closure. He did sense large amount of  aspiration which resulted in delayed cough response that was ineffective to clear aspirates.  Moderate amounts of residuals noted in pharynx which multiple reflexive swallows helpful to decrease but not fully clear.  Chin tuck, head turn right/left did not improve swallow consistently.  Chin tuck worsened and allowed aspiration of thin from residuals spilling into open airway.  Suspect pt has chronic dysphagia with possible low grade aspiration that he manages.  He will remain at risk, especially when he becomes acutely ill and deconditioned.  Recommend continue diet with strict aspiration precautions.  Of note, pt with poor understanding to his dysphagia despite extensive education during MBS.  This may negatively impact his compliance with strategies.  SlP to follow up for dysphagia management. SLP Visit Diagnosis Dysphagia, oropharyngeal phase (R13.12) Attention and concentration deficit following -- Frontal lobe and executive function deficit following -- Impact on safety and function Moderate aspiration risk   CHL IP TREATMENT RECOMMENDATION 11/07/2017 Treatment Recommendations Defer until completion of intrumental exam   Prognosis 11/08/2017 Prognosis for Safe Diet Advancement Guarded Barriers to Reach Goals Cognitive deficits;Motivation;Severity of deficits Barriers/Prognosis Comment -- CHL IP DIET RECOMMENDATION 11/08/2017 SLP Diet Recommendations Dysphagia 3 (Mech soft) solids;No mixed consistencies;Thin liquid- TO MITIGATE ASPIRATION-NOT PREVENT IT Liquid Administration via Cup;Straw Medication Administration Crushed with puree Compensations Slow rate;Small sips/bites;Multiple dry swallows after each  bite/sip;Clear throat intermittently Postural Changes Remain semi-upright after after feeds/meals (Comment);Seated upright at 90 degrees   CHL IP OTHER RECOMMENDATIONS 11/08/2017 Recommended Consults -- Oral Care Recommendations Oral care QID Other Recommendations Have oral suction available   CHL IP FOLLOW UP RECOMMENDATIONS 11/07/2017 Follow up Recommendations (No Data)   No flowsheet data found.     CHL IP ORAL PHASE 11/08/2017 Oral Phase Impaired Oral - Pudding Teaspoon -- Oral - Pudding Cup -- Oral - Honey Teaspoon -- Oral - Honey Cup -- Oral - Nectar Teaspoon Weak lingual manipulation;Decreased bolus cohesion;Premature spillage Oral - Nectar Cup Weak lingual manipulation;Decreased bolus cohesion;Premature spillage Oral - Nectar Straw -- Oral - Thin Teaspoon Weak lingual manipulation;Decreased bolus cohesion;Premature spillage Oral - Thin Cup Weak lingual manipulation;Lingual pumping;Decreased bolus cohesion;Premature spillage Oral - Thin Straw Weak lingual manipulation;Lingual pumping;Decreased bolus cohesion;Premature spillage Oral - Puree Weak lingual manipulation;Decreased bolus cohesion;Premature spillage Oral - Mech Soft -- Oral - Regular Impaired mastication;Weak lingual manipulation;Premature spillage Oral - Multi-Consistency -- Oral - Pill -- Oral Phase - Comment --  CHL IP PHARYNGEAL PHASE 11/08/2017 Pharyngeal Phase Impaired Pharyngeal- Pudding Teaspoon -- Pharyngeal -- Pharyngeal- Pudding Cup -- Pharyngeal -- Pharyngeal- Honey Teaspoon -- Pharyngeal -- Pharyngeal- Honey Cup -- Pharyngeal -- Pharyngeal- Nectar Teaspoon Reduced pharyngeal peristalsis;Reduced epiglottic inversion;Reduced laryngeal elevation;Reduced airway/laryngeal closure;Reduced tongue base retraction;Penetration/Aspiration during swallow;Pharyngeal residue - valleculae;Pharyngeal residue - pyriform;Lateral channel residue Pharyngeal Material enters airway, remains ABOVE vocal cords then ejected out Pharyngeal- Nectar Cup Reduced  epiglottic inversion;Reduced anterior laryngeal mobility;Reduced laryngeal elevation;Reduced airway/laryngeal closure;Reduced tongue base retraction;Penetration/Aspiration during swallow;Penetration/Apiration after swallow;Pharyngeal residue - valleculae;Pharyngeal residue - pyriform;Lateral channel residue Pharyngeal Material enters airway, CONTACTS cords and then ejected out Pharyngeal- Nectar Straw Reduced pharyngeal peristalsis;Reduced epiglottic inversion;Reduced laryngeal elevation;Reduced airway/laryngeal closure;Reduced tongue base retraction;Penetration/Aspiration during swallow;Penetration/Apiration after swallow;Pharyngeal residue - valleculae;Pharyngeal residue - pyriform;Lateral channel residue Pharyngeal Material enters airway, CONTACTS cords and then ejected out Pharyngeal- Thin Teaspoon Reduced pharyngeal peristalsis;Reduced epiglottic inversion;Reduced laryngeal elevation;Reduced airway/laryngeal closure;Reduced tongue base retraction;Penetration/Aspiration before swallow;Pharyngeal residue - valleculae;Pharyngeal residue - pyriform;Pharyngeal residue - cp segment;Lateral channel residue Pharyngeal Material enters airway, remains ABOVE vocal cords and not ejected out Pharyngeal- Thin Cup Reduced  pharyngeal peristalsis;Reduced epiglottic inversion;Reduced laryngeal elevation;Reduced airway/laryngeal closure;Reduced tongue base retraction;Penetration/Aspiration before swallow;Penetration/Aspiration during swallow;Penetration/Apiration after swallow;Pharyngeal residue - valleculae;Pharyngeal residue - pyriform;Lateral channel residue Pharyngeal Material enters airway, CONTACTS cords and not ejected out;Material enters airway, passes BELOW cords then ejected out Pharyngeal- Thin Straw Reduced epiglottic inversion;Reduced laryngeal elevation;Reduced airway/laryngeal closure;Reduced tongue base retraction;Penetration/Aspiration during swallow;Penetration/Apiration after swallow;Moderate  aspiration;Pharyngeal residue - valleculae;Pharyngeal residue - pyriform;Lateral channel residue Pharyngeal Material enters airway, passes BELOW cords and not ejected out despite cough attempt by patient Pharyngeal- Puree Reduced pharyngeal peristalsis;Reduced epiglottic inversion;Reduced laryngeal elevation;Reduced tongue base retraction;Pharyngeal residue - valleculae Pharyngeal -- Pharyngeal- Mechanical Soft -- Pharyngeal -- Pharyngeal- Regular Reduced pharyngeal peristalsis;Reduced epiglottic inversion;Reduced laryngeal elevation;Pharyngeal residue - valleculae Pharyngeal -- Pharyngeal- Multi-consistency -- Pharyngeal -- Pharyngeal- Pill -- Pharyngeal -- Pharyngeal Comment head turn right/left nor chin tuck helpful to decrease residuals, chin tuck worsened airway protection with thin as it spilled residuals into airway, multiple swallows helpful to decrease residuals howevever, cued "hock" did not clear vallecular residuals - but advised pt to continue to work to strengthen this strategy  CHL IP CERVICAL ESOPHAGEAL PHASE 11/08/2017 Cervical Esophageal Phase WFL Pudding Teaspoon -- Pudding Cup -- Honey Teaspoon -- Honey Cup -- Nectar Teaspoon -- Nectar Cup -- Nectar Straw -- Thin Teaspoon -- Thin Cup -- Thin Straw -- Puree -- Mechanical Soft -- Regular -- Multi-consistency -- Pill -- Cervical Esophageal Comment -- No flowsheet data found. Luanna Salk, [place] [facility] SLP [phone]              Dg C-arm 1-60 Min-no Report  Result Date: 11/06/2017 Fluoroscopy was utilized by the requesting physician.  No radiographic interpretation.   Dg Hip Operative Unilat With Pelvis Left  Result Date: 11/06/2017 CLINICAL DATA:  Femoral neck fracture EXAM: OPERATIVE LEFT HIP (WITH PELVIS IF PERFORMED) 2 VIEWS TECHNIQUE: Fluoroscopic spot image(s) were submitted for interpretation post-operatively. COMPARISON:  the previous day's study FINDINGS: A series of fluoroscopic spot images document placement of left hip arthroplasty  hardware, projecting in expected location. No interval fracture or other apparent complication. IMPRESSION: 1. Interval left hip arthroplasty. Electronically Signed   By: Lucrezia Europe M.D.   On: 11/06/2017 10:07   Dg Femur Min 2 Views Left  Result Date: 11/05/2017 CLINICAL DATA:  Fall.  Left thigh pain. EXAM: LEFT FEMUR 2 VIEWS COMPARISON:  No recent prior. FINDINGS: Peripheral vascular calcification. Degenerative change left hip and knee. Displaced left femoral neck fracture is present. IMPRESSION: 1.  Displaced left femoral neck fracture. 2.  Degenerative changes left hip and knee. 3.  Peripheral vascular disease. Electronically Signed   By: Marcello Moores  Register   On: 11/05/2017 15:13       IMPRESSION/PLAN: 1. Putative Stage IA cT1aN0 NSCLC of the left lower lobe. We reviewed the findings from the imaging studies and previous pathology from his prior cancer, and reviewed our prior discussion. He is in need of a repeat PET given the changes in his scan, and appears to still be a candidate for stereotactic body radiotherapy (SBRT). If we proceed with SBRT, we would offer the patient a course of 3 fractions. We discussed the risks, benefits, short, and long term effects of radiotherapy. We will coordinate outpatient PET scan. 2.         History of synchronous Stage IA pT1aN0 NSCLC, adenocarcinoma and squamous cell carcinoma of the right upper lobe. The patient has done well since his right upper lobectomy in 2016 and will continue in surveillance once we complete treatment for #1.  3.         Hoarseness, loss of olfactory and taste. The patient has been seen by the [facility] in the ENT department. We will follow this expectantly but follow up with his PCP to ensure this was completley worked up last year as the patient had indicated previously.       Carola Rhine, PAC

## 2017-11-08 NOTE — Evaluation (Signed)
Occupational Therapy Evaluation Patient Details Name: Andre Silva MRN: [id number] DOB: 1934-10-11 Today's Date: 11/08/2017    History of Present Illness Andre Silva is a 83 y.o. male adm after mechanical fall resulting in femoral neck fx, s/p L DA THA;  medical history significant of NSCLC s/p RUL lobectomy in 2016.  10mm irregular nodule in LLL suspicious for recurrence in Aug 2018.   Clinical Impression    Pt admitted with fall with hip fx.. Pt currently with functional limitations due to the deficits listed below (see OT Problem List). Pt will benefit from skilled OT to increase their safety and independence with ADL and functional mobility for ADL to facilitate discharge to venue listed below.    Follow Up Recommendations  SNF    Equipment Recommendations  None recommended by OT    Recommendations for Other Services       Precautions / Restrictions Precautions Precautions: Fall      Mobility Bed Mobility               General bed mobility comments: pt in chair  Transfers Overall transfer level: Needs assistance Equipment used: Rolling walker (2 wheeled) Transfers: Sit to/from [facility] Sit to Stand: Min assist Stand pivot transfers: Min assist       General transfer comment: assist to rise and stabilize, cues for hand placement and safety        ADL either performed or assessed with clinical judgement   ADL Overall ADL's : Needs assistance/impaired Eating/Feeding: Set up;Sitting   Grooming: Minimal assistance;Sitting   Upper Body Bathing: Minimal assistance;Sitting   Lower Body Bathing: Maximal assistance;Sit to/from stand;Cueing for sequencing;Cueing for safety   Upper Body Dressing : Minimal assistance;Sitting   Lower Body Dressing: Maximal assistance;Sit to/from stand;Cueing for sequencing;Cueing for safety   Toilet Transfer: Moderate assistance;BSC;RW   Toileting- Clothing Manipulation and Hygiene: Moderate  assistance;Sit to/from stand;Cueing for safety               Vision Patient Visual Report: No change from baseline       Perception     Praxis      Pertinent Vitals/Pain Pain Assessment: Faces Faces Pain Scale: Hurts a little bit Pain Location: L hip Pain Descriptors / Indicators: Guarding Pain Intervention(s): Limited activity within patient's tolerance;Monitored during session     Hand Dominance     Extremity/Trunk Assessment Upper Extremity Assessment Upper Extremity Assessment: Generalized weakness           Communication Communication Communication: Other (comment)(difficult to understand at times)   Cognition Arousal/Alertness: Awake/alert Behavior During Therapy: WFL for tasks assessed/performed Overall Cognitive Status: Impaired/Different from baseline Area of Impairment: Safety/judgement;Awareness                               General Comments: Pt appears somewhat confused which is not normal per girlfriend.    General Comments       Exercises     Shoulder Instructions      Home Living Family/patient expects to be discharged to:: Private residence Living Arrangements: Spouse/significant other(per chart) Available Help at Discharge: Family Type of Home: House       Home Layout: One level     Bathroom Shower/Tub: Teacher, early years/pre: Standard                Prior Functioning/Environment Level of Independence: Independent  OT Problem List: Decreased strength;Decreased safety awareness;Decreased knowledge of precautions;Impaired balance (sitting and/or standing);Decreased knowledge of use of DME or AE;Pain;Decreased activity tolerance      OT Treatment/Interventions: Self-care/ADL training;Patient/family education;Therapeutic activities;DME and/or AE instruction    OT Goals(Current goals can be found in the care plan section) Acute Rehab OT Goals Patient Stated Goal: get home OT  Goal Formulation: With patient Time For Goal Achievement: 11/15/17 Potential to Achieve Goals: Good  OT Frequency: Min 2X/week   Barriers to D/C:            Co-evaluation              AM-PAC PT "6 Clicks" Daily Activity     Outcome Measure Help from another person eating meals?: None Help from another person taking care of personal grooming?: A Little Help from another person toileting, which includes using toliet, bedpan, or urinal?: A Lot Help from another person bathing (including washing, rinsing, drying)?: A Lot Help from another person to put on and taking off regular upper body clothing?: A Little Help from another person to put on and taking off regular lower body clothing?: A Lot 6 Click Score: 16   End of Session Nurse Communication: Mobility status  Activity Tolerance: Patient tolerated treatment well Patient left: in chair;with call bell/phone within reach;with chair alarm set;with family/visitor present  OT Visit Diagnosis: Unsteadiness on feet (R26.81);History of falling (Z91.81);Muscle weakness (generalized) (M62.81);Pain Pain - Right/Left: Left Pain - part of body: Hip                Time: 4825-0037 OT Time Calculation (min): 15 min Charges:  OT General Charges $OT Visit: 1 Visit OT Evaluation $OT Eval Moderate Complexity: 1 Mod G-Codes:     Kari Baars, [place]  Payton Mccallum D 11/08/2017, 1:03 PM

## 2017-11-09 DIAGNOSIS — E43 Unspecified severe protein-calorie malnutrition: Secondary | ICD-10-CM

## 2017-11-09 LAB — CBC
HCT: 21.8 % — ABNORMAL LOW (ref 39.0–52.0)
HEMATOCRIT: 24.8 % — AB (ref 39.0–52.0)
HEMOGLOBIN: 7.2 g/dL — AB (ref 13.0–17.0)
HEMOGLOBIN: 8.1 g/dL — AB (ref 13.0–17.0)
MCH: 29.3 pg (ref 26.0–34.0)
MCH: 29.3 pg (ref 26.0–34.0)
MCHC: 33 g/dL (ref 30.0–36.0)
MCHC: 32.7 g/dL (ref 30.0–36.0)
MCV: 88.6 fL (ref 78.0–100.0)
MCV: 89.9 fL (ref 78.0–100.0)
Platelets: 203 10*3/uL (ref 150–400)
Platelets: 187 10*3/uL (ref 150–400)
RBC: 2.46 MIL/uL — AB (ref 4.22–5.81)
RBC: 2.76 MIL/uL — ABNORMAL LOW (ref 4.22–5.81)
RDW: 19 % — ABNORMAL HIGH (ref 11.5–15.5)
RDW: 17.6 % — AB (ref 11.5–15.5)
WBC: 5.2 10*3/uL (ref 4.0–10.5)
WBC: 5.2 10*3/uL (ref 4.0–10.5)

## 2017-11-09 LAB — PREPARE RBC (CROSSMATCH)

## 2017-11-09 MED ORDER — HYDROCODONE-ACETAMINOPHEN 5-325 MG PO TABS: 1.0000 | ORAL_TABLET | Freq: Four times a day (QID) | ORAL | 0 refills | Status: AC | PRN

## 2017-11-09 MED ORDER — ASPIRIN 81 MG PO CHEW: 81.0000 mg | CHEWABLE_TABLET | Freq: Two times a day (BID) | ORAL | 0 refills | Status: AC

## 2017-11-09 MED ORDER — ASPIRIN 81 MG PO CHEW
81.0000 mg | CHEWABLE_TABLET | Freq: Two times a day (BID) | ORAL | Status: DC
  Administered 2017-11-09 (×2): 81 mg via ORAL
  Filled 2017-11-09 (×2): qty 1

## 2017-11-09 MED ORDER — SODIUM CHLORIDE 0.9 % IV SOLN
Freq: Once | INTRAVENOUS | Status: AC
  Administered 2017-11-09: 10:00:00 via INTRAVENOUS

## 2017-11-09 NOTE — Clinical Social Work Placement (Addendum)
D/C Summary sent via [facility].  [facility] called for transport.  Patient son informed of his transport, attempted to call patient GF, number responds busy.   CLINICAL SOCIAL WORK PLACEMENT  NOTE  Date:  11/09/2017  Patient Details  Name: Andre Silva MRN: [id number] Date of Birth: 10/28/1934  Clinical Social Work is seeking post-discharge placement for this patient at the Cataract level of care (*CSW will initial, date and re-position this form in  chart as items are completed):  Yes   Patient/family provided with [facility] Department's list of facilities offering this level of care within the geographic area requested by the patient (or if unable, by the patient's family).  Yes   Patient/family informed of their freedom to choose among providers that offer the needed level of care, that participate in Medicare, Medicaid or managed care program needed by the patient, have an available bed and are willing to accept the patient.  Yes   Patient/family informed of [facility]'s ownership interest in [facility] and [facility], as well as of the fact that they are under no obligation to receive care at these facilities.  PASRR submitted to [facility]DS on 11/07/17     PASRR number received on 11/07/17     Existing PASRR number confirmed on       FL2 transmitted to all facilities in geographic area requested by pt/family on       FL2 transmitted to all facilities within larger geographic area on 11/09/17     Patient informed that his/her managed care company has contracts with or will negotiate with certain facilities, including the following:  [facility]     No   Patient/family informed of bed offers received.  Patient chooses bed at [facility]     Physician recommends and patient chooses bed at      Patient to be transferred to [facility] on 11/09/17.  Patient to be transferred to facility by [facility]     Patient family  notified on 11/09/17 of transfer.  Name of family member notified:  Son: Mikkel Charrette     PHYSICIAN Please prepare priority discharge summary, including medications, Please prepare prescriptions     Additional Comment:    _______________________________________________ Lia Hopping, LCSW 11/09/2017, 12:00 PM

## 2017-11-09 NOTE — Progress Notes (Signed)
PT Cancellation Note  Patient Details Name: Andre Silva MRN: [id number] DOB: 07-29-1934   Cancelled Treatment:       HgB 7.2 pt scheduled to receive 2 units of blood.     Rica Koyanagi  PTA [facility]  Acute  Rehab Pager      [phone]

## 2017-11-09 NOTE — Care Management Important Message (Signed)
Important Message  Patient Details  Name: Andre Silva MRN: [id number] Date of Birth: [birth year]   Medicare Important Message Given:  Yes    Kerin Salen 11/09/2017, 11:31 AMImportant Message  Patient Details  Name: Andre Silva MRN: [id number] Date of Birth: Dec 08, 1934   Medicare Important Message Given:  Yes    Kerin Salen 11/09/2017, 11:31 AM

## 2017-11-09 NOTE — Discharge Instructions (Signed)
INSTRUCTIONS AFTER JOINT REPLACEMENT  ° °o Remove items at home which could result in a fall. This includes throw rugs or furniture in walking pathways °o ICE to the affected joint every three hours while awake for 30 minutes at a time, for at least the first 3-5 days, and then as needed for pain and swelling.  Continue to use ice for pain and swelling. You may notice swelling that will progress down to the foot and ankle.  This is normal after surgery.  Elevate your leg when you are not up walking on it.   °o Continue to use the breathing machine you got in the hospital (incentive spirometer) which will help keep your temperature down.  It is common for your temperature to cycle up and down following surgery, especially at night when you are not up moving around and exerting yourself.  The breathing machine keeps your lungs expanded and your temperature down. ° ° °DIET:  As you were doing prior to hospitalization, we recommend a well-balanced diet. ° °DRESSING / WOUND CARE / SHOWERING ° °Keep the surgical dressing until follow up.  The dressing is water proof, so you can shower without any extra covering.  IF THE DRESSING FALLS OFF or the wound gets wet inside, change the dressing with sterile gauze.  Please use good hand washing techniques before changing the dressing.  Do not use any lotions or creams on the incision until instructed by your surgeon.   ° °ACTIVITY ° °o Increase activity slowly as tolerated, but follow the weight bearing instructions below.   °o No driving for 6 weeks or until further direction given by your physician.  You cannot drive while taking narcotics.  °o No lifting or carrying greater than 10 lbs. until further directed by your surgeon. °o Avoid periods of inactivity such as sitting longer than an hour when not asleep. This helps prevent blood clots.  °o You may return to work once you are authorized by your doctor.  ° ° ° °WEIGHT BEARING  ° °Weight bearing as tolerated with assist  device (walker, cane, etc) as directed, use it as long as suggested by your surgeon or therapist, typically at least 4-6 weeks. ° ° °EXERCISES ° °Results after joint replacement surgery are often greatly improved when you follow the exercise, range of motion and muscle strengthening exercises prescribed by your doctor. Safety measures are also important to protect the joint from further injury. Any time any of these exercises cause you to have increased pain or swelling, decrease what you are doing until you are comfortable again and then slowly increase them. If you have problems or questions, call your caregiver or physical therapist for advice.  ° °Rehabilitation is important following a joint replacement. After just a few days of immobilization, the muscles of the leg can become weakened and shrink (atrophy).  These exercises are designed to build up the tone and strength of the thigh and leg muscles and to improve motion. Often times heat used for twenty to thirty minutes before working out will loosen up your tissues and help with improving the range of motion but do not use heat for the first two weeks following surgery (sometimes heat can increase post-operative swelling).  ° °These exercises can be done on a training (exercise) mat, on the floor, on a table or on a bed. Use whatever works the best and is most comfortable for you.    Use music or television while you are exercising so that   the exercises are a pleasant break in your day. This will make your life better with the exercises acting as a break in your routine that you can look forward to.   Perform all exercises about fifteen times, three times per day or as directed.  You should exercise both the operative leg and the other leg as well. ° °Exercises include: °  °• Quad Sets - Tighten up the muscle on the front of the thigh (Quad) and hold for 5-10 seconds.   °• Straight Leg Raises - With your knee straight (if you were given a brace, keep it on),  lift the leg to 60 degrees, hold for 3 seconds, and slowly lower the leg.  Perform this exercise against resistance later as your leg gets stronger.  °• Leg Slides: Lying on your back, slowly slide your foot toward your buttocks, bending your knee up off the floor (only go as far as is comfortable). Then slowly slide your foot back down until your leg is flat on the floor again.  °• Angel Wings: Lying on your back spread your legs to the side as far apart as you can without causing discomfort.  °• Hamstring Strength:  Lying on your back, push your heel against the floor with your leg straight by tightening up the muscles of your buttocks.  Repeat, but this time bend your knee to a comfortable angle, and push your heel against the floor.  You may put a pillow under the heel to make it more comfortable if necessary.  ° °A rehabilitation program following joint replacement surgery can speed recovery and prevent re-injury in the future due to weakened muscles. Contact your doctor or a physical therapist for more information on knee rehabilitation.  ° ° °CONSTIPATION ° °Constipation is defined medically as fewer than three stools per week and severe constipation as less than one stool per week.  Even if you have a regular bowel pattern at home, your normal regimen is likely to be disrupted due to multiple reasons following surgery.  Combination of anesthesia, postoperative narcotics, change in appetite and fluid intake all can affect your bowels.  ° °YOU MUST use at least one of the following options; they are listed in order of increasing strength to get the job done.  They are all available over the counter, and you may need to use some, POSSIBLY even all of these options:   ° °Drink plenty of fluids (prune juice may be helpful) and high fiber foods °Colace 100 mg by mouth twice a day  °Senokot for constipation as directed and as needed Dulcolax (bisacodyl), take with full glass of water  °Miralax (polyethylene glycol)  once or twice a day as needed. ° °If you have tried all these things and are unable to have a bowel movement in the first 3-4 days after surgery call either your surgeon or your primary doctor.   ° °If you experience loose stools or diarrhea, hold the medications until you stool forms back up.  If your symptoms do not get better within 1 week or if they get worse, check with your doctor.  If you experience "the worst abdominal pain ever" or develop nausea or vomiting, please contact the office immediately for further recommendations for treatment. ° ° °ITCHING:  If you experience itching with your medications, try taking only a single pain pill, or even half a pain pill at a time.  You can also use Benadryl over the counter for itching or also to   help with sleep.   TED HOSE STOCKINGS:  Use stockings on both legs until for at least 2 weeks or as directed by physician office. They may be removed at night for sleeping.  MEDICATIONS:  See your medication summary on the After Visit Summary that nursing will review with you.  You may have some home medications which will be placed on hold until you complete the course of blood thinner medication.  It is important for you to complete the blood thinner medication as prescribed.  PRECAUTIONS:  If you experience chest pain or shortness of breath - call 911 immediately for transfer to the hospital emergency department.   If you develop a fever greater that 101 F, purulent drainage from wound, increased redness or drainage from wound, foul odor from the wound/dressing, or calf pain - CONTACT YOUR SURGEON.                                                   FOLLOW-UP APPOINTMENTS:  If you do not already have a post-op appointment, please call the office for an appointment to be seen by your surgeon.  Guidelines for how soon to be seen are listed in your After Visit Summary, but are typically between 1-4 weeks after surgery.  OTHER INSTRUCTIONS:   Knee  Replacement:  Do not place pillow under knee, focus on keeping the knee straight while resting. CPM instructions: 0-90 degrees, 2 hours in the morning, 2 hours in the afternoon, and 2 hours in the evening. Place foam block, curve side up under heel at all times except when in CPM or when walking.  DO NOT modify, tear, cut, or change the foam block in any way.  MAKE SURE YOU:   Understand these instructions.   Get help right away if you are not doing well or get worse.    Thank you for letting us be a part of your medical care team.  It is a privilege we respect greatly.  We hope these instructions will help you stay on track for a fast and full recovery!    Additional Discharge Instructions  Please get your medications reviewed and adjusted by your Primary MD.  Please request your Primary MD to go over all Hospital Tests and Procedure/Radiological results at the follow up, please get all Hospital records sent to your Prim MD by signing hospital release before you go home.  If you had Pneumonia of Lung problems at the Hospital: Please get a 2 view Chest X ray done in 6-8 weeks after hospital discharge or sooner if instructed by your Primary MD.  If you have Congestive Heart Failure: Please call your Cardiologist or Primary MD anytime you have any of the following symptoms:  1) 3 pound weight gain in 24 hours or 5 pounds in 1 week  2) shortness of breath, with or without a dry hacking cough  3) swelling in the hands, feet or stomach  4) if you have to sleep on extra pillows at night in order to breathe  Follow cardiac low salt diet and 1.5 lit/day fluid restriction.  If you have diabetes Accuchecks 4 times/day, Once in AM empty stomach and then before each meal. Log in all results and show them to your primary doctor at your next visit. If any glucose reading is under 80 or above 300 call  your primary MD immediately.  If you have Seizure/Convulsions/Epilepsy: Please do not drive,  operate heavy machinery, participate in activities at heights or participate in high speed sports until you have seen by Primary MD or a Neurologist and advised to do so again.  If you had Gastrointestinal Bleeding: Please ask your Primary MD to check a complete blood count within one week of discharge or at your next visit. Your endoscopic/colonoscopic biopsies that are pending at the time of discharge, will also need to followed by your Primary MD.  Get Medicines reviewed and adjusted. Please take all your medications with you for your next visit with your Primary MD  Please request your Primary MD to go over all hospital tests and procedure/radiological results at the follow up, please ask your Primary MD to get all Hospital records sent to his/her office.  If you experience worsening of your admission symptoms, develop shortness of breath, life threatening emergency, suicidal or homicidal thoughts you must seek medical attention immediately by calling 911 or calling your MD immediately  if symptoms less severe.  You must read complete instructions/literature along with all the possible adverse reactions/side effects for all the Medicines you take and that have been prescribed to you. Take any new Medicines after you have completely understood and accpet all the possible adverse reactions/side effects.   Do not drive or operate heavy machinery when taking Pain medications.   Do not take more than prescribed Pain, Sleep and Anxiety Medications  Special Instructions: If you have smoked or chewed Tobacco  in the last 2 yrs please stop smoking, stop any regular Alcohol  and or any Recreational drug use.  Wear Seat belts while driving.  Please note You were cared for by a hospitalist during your hospital stay. If you have any questions about your discharge medications or the care you received while you were in the hospital after you are discharged, you can call the unit and asked to speak with  the hospitalist on call if the hospitalist that took care of you is not available. Once you are discharged, your primary care physician will handle any further medical issues. Please note that NO REFILLS for any discharge medications will be authorized once you are discharged, as it is imperative that you return to your primary care physician (or establish a relationship with a primary care physician if you do not have one) for your aftercare needs so that they can reassess your need for medications and monitor your lab values.  You can reach the hospitalist office at phone [phone] or fax [phone]   If you do not have a primary care physician, you can call [phone] for a physician referral.

## 2017-11-09 NOTE — Progress Notes (Signed)
Patient ID: Andre Silva, male   DOB: 1935/03/17, 83 y.o.   MRN: [id number] No acute changes from Ortho standpoint.  Left hip dressing changed and incision looks good.  He does have a hgb of 7.2.  Will leave transfusion decision to primary team.  Slow mobility with therapy.  Can WBAT.  Will just recommend 81 mg aspirin twice daily for DVT coverage.

## 2017-11-09 NOTE — Discharge Summary (Signed)
Physician Discharge Summary  Andre Silva KGU:[id number] DOB: 10-22-1934  PCP: [facility]  Admit date: 11/05/2017 Discharge date: 11/09/2017  Recommendations for Outpatient Follow-up:  1. MD at SNF in 2 days with repeat labs (CBC & BMP). 2. Dr. Jean Rosenthal, Orthopedics in 2 weeks for postop follow-up. 3. Dr. Tyler Pita, Radiation Oncology regarding lung cancer follow-up.  SNF to coordinate.  Home Health: N/A.  Patient being discharged to [facility]. Equipment/Devices: N/A    Discharge Condition: Improved and stable. CODE STATUS: Full. Diet recommendation: Modified diet as per speech therapy recommendations as follows:  SLP Diet Recommendations: Dysphagia 3 (Mech soft) solids;No mixed consistencies;Thin liquid TO MITIGATE ASPIRATION-NOT PREVENT IT Liquid Administration via: Cup;Straw Medication Administration: Crushed with puree(if large) Supervision: Patient able to self feed;Full assist for feeding Compensations: Slow rate;Small sips/bites;Multiple dry swallows after each bite/sip;Clear throat intermittently Postural Changes: Remain semi-upright after after feeds/meals (Comment);Seated upright at 90 degrees Oral Care Recommendations: Oral care QID Other Recommendations: Have oral suction available    Discharge Diagnoses:  Principal Problem:   Closed left hip fracture, initial encounter ([place]) Active Problems:   HTN (hypertension)   Primary cancer of right upper lobe of lung ([place])   Presumed primary cancer of left lower lobe of lung ([place])   HLD (hyperlipidemia)   Protein-calorie malnutrition, severe   Brief Summary: 83 year old male with PMH of NSCLC RUL status post RUL lobectomy 2016, NSCLC left lower lobe last seen by radiation oncology on 10/26/2016 and appears to have been lost to follow-up, hoarseness/loss of olfactory and taste, HTN, HLD, sustained mechanical fall on 5/17 without LOC and presented with severe left hip pain and  admitted for left hip fracture.  Orthopedics consulted and patient underwent left total hip arthroplasty anterior approach on 5/18.   Assessment & Plan:  Left femoral neck fracture: Orthopedics/Dr. Ninfa Linden consulted and patient underwent left total hip arthroplasty anterior approach on 5/18. I discussed with Dr. Ninfa Linden on 5/20 who indicated that at surgery, patient's fracture did not appear like a pathological fracture but seem more like usual traumatic fracture.  I discussed with Dr. Ninfa Linden who cleared patient for discharge to SNF.  Recommends weightbearing as tolerated on left lower extremity and aspirin 81 mg daily twice daily x1 month post surgery for DVT prophylaxis following which he can be switched to prior aspirin 325 mg daily.  Lung cancer: History as indicated above.  PET scan 12/01/2016: Posterior LLL solid 7 mm pulmonary nodule with low-level metabolism, stable since 10/15/2016, suspicious for a metachronous primary bronchogenic carcinoma.  Cannot exclude nodal metastasis to subcarinal lymph node.  Admitting MD mentions 8 mm irregular nodule on CT 02/17/2017 that was suspicious but I am unable to find that report.  CT chest 5/17: Interval increase in size of LLL pulmonary spiculated lesion, seems to be have doubled in size.  Additional spiculated focus in left lower lobe.  Concern for recurrent lung cancer.  I have communicated with radiation and medical oncology who are aware of this patient and will coordinate cancer care.  Essential hypertension: Soft blood pressures and hence home dose of amlodipine and metoprolol on hold since 5/19.    At discharge, blood pressures better and resume metoprolol.  Amlodipine discontinued.  Hyperlipidemia: Continue statins.  Dual statins were listed.  Continued atorvastatin and discontinued simvastatin.  Acute blood loss anemia: Related to fracture and surgical blood loss.  Hemoglobin dropped from 11.2 preop >9.4 > 8.1 >7.2.    Transfuse 1 unit  PRBC prior  to discharge and hemoglobin appropriately increased to 8.1.  Follow CBC in 2 days and transfuse if hemoglobin less than 7 g per DL.  Tobacco abuse: Cessation counseled.  Not on home oxygen.  COPD: Seen on imaging studies.  No clinical bronchospasm.  Monitor.  Aortic atherosclerosis/CAD: Seen on CT chest.  No chest pain.  Hoarseness/dysphagia: Unclear etiology.?  Related to cancer or prior treatment.  Oncologist were attempting to get previous records as outpatient.  Outpatient follow-up.  Patient underwent evaluation by speech therapist including MBS and they have initiated him on a dysphagia 3 diet and thin liquids.  Some concern if his current lung cancer versus prior treatment are responsible for his dysphagia i.e. recurrent laryngeal nerve palsy/Vagus involvement, communicated with oncology.  Needs to be closely evaluated and followed up as outpatient.  I have discussed this in detail multiple times with patient's son and he verbalized understanding and assures that he will be involved in patient's care since patient probably does not have full understanding or insight into his diagnosis/condition.  Fever: ?  Related to postop surgical site intervention.  Chest x-ray without pneumonia.  Improved.  No further evaluation.   Consultants:  Orthopedics/Dr. Ninfa Linden  Procedures:  Left total hip arthroplasty anterior approach on 5/18   Discharge Instructions  Discharge Instructions    Call MD for:  difficulty breathing, headache or visual disturbances   Complete by:  As directed    Call MD for:  extreme fatigue   Complete by:  As directed    Call MD for:  persistant dizziness or light-headedness   Complete by:  As directed    Call MD for:  persistant nausea and vomiting   Complete by:  As directed    Call MD for:  redness, tenderness, or signs of infection (pain, swelling, redness, odor or green/yellow discharge around incision site)   Complete by:  As directed     Call MD for:  severe uncontrolled pain   Complete by:  As directed    Call MD for:  temperature >100.4   Complete by:  As directed    Discharge instructions   Complete by:  As directed    Diet: As per speech therapy recommendations as follows:  SLP Diet Recommendations: Dysphagia 3 (Mech soft) solids;No mixed consistencies;Thin liquid TO MITIGATE ASPIRATION-NOT PREVENT IT   Liquid Administration via: Cup;Straw   Medication Administration: Crushed with puree(if large)   Supervision: Patient able to self feed;Full assist for feeding   Compensations: Slow rate;Small sips/bites;Multiple dry swallows after each bite/sip;Clear throat intermittently   Postural Changes: Remain semi-upright after after feeds/meals (Comment);Seated upright at 90 degrees   Oral Care Recommendations: Oral care QID   Other Recommendations: Have oral suction available   Increase activity slowly   Complete by:  As directed        Medication List    STOP taking these medications   amLODipine 10 MG tablet Commonly known as:  NORVASC   amLODipine 5 MG tablet Commonly known as:  NORVASC   aspirin 325 MG tablet Replaced by:  aspirin 81 MG chewable tablet   simvastatin 20 MG tablet Commonly known as:  ZOCOR     TAKE these medications   aspirin 81 MG chewable tablet Chew 1 tablet (81 mg total) by mouth 2 (two) times daily after a meal. Replaces:  aspirin 325 MG tablet   atorvastatin 40 MG tablet Commonly known as:  LIPITOR Take 40 mg by mouth at bedtime.   cetirizine 10 MG  tablet Commonly known as:  ZYRTEC Take 10 mg by mouth daily.   FISH OIL PO Take 1 capsule by mouth daily.   HYDROcodone-acetaminophen 5-325 MG tablet Commonly known as:  NORCO Take 1-2 tablets by mouth every 6 (six) hours as needed for moderate pain.   metoprolol tartrate 50 MG tablet Commonly known as:  LOPRESSOR Take 25 mg by mouth 2 (two) times daily.   pantoprazole 40 MG tablet Commonly known as:   PROTONIX Take 40 mg by mouth 2 (two) times daily.   ranitidine 300 MG tablet Commonly known as:  ZANTAC Take 300 mg by mouth at bedtime.   tamsulosin 0.4 MG Caps capsule Commonly known as:  FLOMAX Take 0.4 mg by mouth daily.   zinc gluconate 50 MG tablet Take 50 mg by mouth daily.       Contact information for follow-up providers    Mcarthur Rossetti, MD. Schedule an appointment as soon as possible for a visit in 2 week(s).   Specialty:  Orthopedic Surgery Contact information: [address] [place] [address] [phone]        MD at SNF. Schedule an appointment as soon as possible for a visit in 2 day(s).   Why:  To be seen with repeat labs (CBC & BMP).       Tyler Pita, MD. Schedule an appointment as soon as possible for a visit.   Specialty:  Radiation Oncology Contact information: [place] [address] [phone]            Contact information for after-discharge care    [facility] .   Service:  Skilled Nursing Contact information: [address] [place] [phone]                 No Known Allergies    Procedures/Studies: Dg Chest 2 View  Result Date: 11/08/2017 CLINICAL DATA:  Fever.  Rule out aspiration pneumonia EXAM: CHEST - 2 VIEW COMPARISON:  Three days ago FINDINGS: Large lung volumes. Normal heart size and aortic contours. Postoperative right hilum. There is no edema, consolidation, effusion, or pneumothorax. IMPRESSION: 1. No evidence of active disease. 2. COPD. 3. Postoperative chest on the right. Reference chest CT 11/05/2017 concerning pulmonary nodules. Electronically Signed   By: Monte Fantasia M.D.   On: 11/08/2017 09:08   Dg Chest 2 View  Result Date: 11/05/2017 CLINICAL DATA:  Weakness.  Productive cough for 2 days. EXAM: CHEST - 2 VIEW COMPARISON:  09/04/2005. FINDINGS: Cardiac silhouette is normal in size. No  mediastinal or hilar masses. There are surgical vascular clips projecting over the right hilum, new since the prior chest radiographs, stable from a PET-CT dated 12/01/2016. Lungs are hyperexpanded but clear. No pleural effusion or pneumothorax. Skeletal structures are demineralized. IMPRESSION: No acute cardiopulmonary disease. Hyperexpanded lungs consistent with COPD. Electronically Signed   By: Lajean Manes M.D.   On: 11/05/2017 17:30   Ct Chest Wo Contrast  Result Date: 11/05/2017 CLINICAL DATA:  83 year old male with history of right upper lobectomy for lung cancer demonstrating 2 synchronous primary bronchogenic carcinoma is, one adenocarcinoma and one squamous cell carcinoma with left lower lobe pulmonary nodule seen on PET-CT dated 12/01/2016. EXAM: CT CHEST WITHOUT CONTRAST TECHNIQUE: Multidetector CT imaging of the chest was performed following the standard protocol without IV contrast. COMPARISON:  12/01/2016 PET-CT FINDINGS: Cardiovascular: Heart size is normal. Left main and three-vessel coronary arteriosclerosis. No significant pericardial effusion or  pleural thickening. Nonaneurysmal atherosclerotic aorta. Unremarkable noncontrast appearance of the pulmonary vasculature. Mediastinum/Nodes: No pathologically enlarged lymph nodes within the mediastinum. Assessment for hilar lymphadenopathy is limited due to lack of IV contrast. Patent trachea and mainstem bronchi. Unremarkable esophagus. Lungs/Pleura: Larger spiculated mass in the left lower lobe now measuring 14 x 15 x 11 mm versus 7 mm previously. Additional 5 x 2 x 7 mm spiculation seen in the left lower lobe. No effusion or pneumothorax. Paraseptal emphysema at the left lung apex. Right upper lobectomy postop change. Upper Abdomen: No acute abnormality. Musculoskeletal: No aggressive osseous lesions. IMPRESSION: 1. Interval increase in size of left lower lobe pulmonary spiculated lesion now measuring 14 x 15 x 11 mm versus 7 mm previously.  Additional 5 x 2 x 7 mm spiculated focus in the left lower lobe abutting the diaphragmatic pleura. 2. Resolution of subpleural inflammatory change in the right lower lobe since prior PET-CT. 3. Aortic atherosclerosis with left main and three-vessel coronary arteriosclerosis. Aortic Atherosclerosis (ICD10-I70.0) and Emphysema (ICD10-J43.9). Electronically Signed   By: Ashley Royalty M.D.   On: 11/05/2017 21:17   Pelvis Portable  Result Date: 11/06/2017 CLINICAL DATA:  Left hip replacement surgery EXAM: PORTABLE PELVIS 1-2 VIEWS COMPARISON:  the previous day's study FINDINGS: Interval placement of left hip arthroplasty components, projecting in expected location. No fracture or dislocation. Lateral skin staples. Stable pelvic vascular clips. Patchy iliofemoral arterial calcifications. IMPRESSION: 1. Left hip arthroplasty without apparent complication. Electronically Signed   By: Lucrezia Europe M.D.   On: 11/06/2017 12:13   Dg Swallowing Func-speech Pathology  Result Date: 11/08/2017 Objective Swallowing Evaluation: Type of Study: MBS-Modified Barium Swallow Study  Patient Details Name: GAYLEN VENNING MRN: [id number] Date of Birth: March 26, 1935 Today's Date: 11/08/2017 Time: SLP Start Time (ACUTE ONLY): 0830 -SLP Stop Time (ACUTE ONLY): 0901 SLP Time Calculation (min) (ACUTE ONLY): 31 min Past Medical History: Past Medical History: Diagnosis Date . Absent sense of smell  . Corns and callosities  . Hypertension  . Intermittent cerebral ischemia  . Knee joint pain  . Low back pain  . Lung cancer ([place])  . Problems with swallowing and mastication  . Skin cyst  . Unspecified urinary incontinence  Past Surgical History: Past Surgical History: Procedure Laterality Date . LOBECTOMY Right 04/15/2015  RUL lobectomy HPI: Pt is an 83 year old male with PMH of NSCLC RUL status post RUL lobectomy 2016, NSCLC left lower lobe last seen by radiation oncology on 10/26/2016 and appears to have been lost to follow-up, hoarseness/loss of  olfactory and taste, HTN, HLD, sustained mechanical fall on 5/17 without LOC and presented with severe left hip pain and admitted for left hip fracture. Orthopedics consulted and patient underwent left total hip arthroplasty anterior approach on 5/18. Per chart review pt has described trouble swallowing as far back as March 2015, at which time he said food got "stuck" and he soemtimes had trouble managing his secretions.Pt underwent BSE yesterday and was made npo with plan for MBS today.  Subjective: pt with poor understanding to purpose of exam despite education to concerns for swallowing deficits Assessment / Plan / Recommendation CHL IP CLINICAL IMPRESSIONS 11/08/2017 Clinical Impression Patient presents with moderately severe oropharyngeal dysphagia with both sensorimotor and mechanical deficits.  Technically challenging MBS due to pt's positioning *shoulder blocking larynx*.  Decreased oral coordination results in boluses spilling into pharynx poorly controlled.   Appearance of cervical osteophytes/anterior curvature of cervical spine prevent adequate epiglottic deflection and thus results in vallecular residuals across all  consistencies.  Pt with laryngeal penetration of all liquid consistencies tested (nectar, thin) - before, during and after the swallow at various times due to decreased epiglottic deflection and decreased airway closure. He did sense large amount of aspiration which resulted in delayed cough response that was ineffective to clear aspirates.  Moderate amounts of residuals noted in pharynx which multiple reflexive swallows helpful to decrease but not fully clear.  Chin tuck, head turn right/left did not improve swallow consistently.  Chin tuck worsened and allowed aspiration of thin from residuals spilling into open airway.  Suspect pt has chronic dysphagia with possible low grade aspiration that he manages.  He will remain at risk, especially when he becomes acutely ill and deconditioned.   Recommend continue diet with strict aspiration precautions.  Of note, pt with poor understanding to his dysphagia despite extensive education during MBS.  This may negatively impact his compliance with strategies.  SlP to follow up for dysphagia management. SLP Visit Diagnosis Dysphagia, oropharyngeal phase (R13.12) Attention and concentration deficit following -- Frontal lobe and executive function deficit following -- Impact on safety and function Moderate aspiration risk   CHL IP TREATMENT RECOMMENDATION 11/07/2017 Treatment Recommendations Defer until completion of intrumental exam   Prognosis 11/08/2017 Prognosis for Safe Diet Advancement Guarded Barriers to Reach Goals Cognitive deficits;Motivation;Severity of deficits Barriers/Prognosis Comment -- CHL IP DIET RECOMMENDATION 11/08/2017 SLP Diet Recommendations Dysphagia 3 (Mech soft) solids;No mixed consistencies;Thin liquid- TO MITIGATE ASPIRATION-NOT PREVENT IT Liquid Administration via Cup;Straw Medication Administration Crushed with puree Compensations Slow rate;Small sips/bites;Multiple dry swallows after each bite/sip;Clear throat intermittently Postural Changes Remain semi-upright after after feeds/meals (Comment);Seated upright at 90 degrees   CHL IP OTHER RECOMMENDATIONS 11/08/2017 Recommended Consults -- Oral Care Recommendations Oral care QID Other Recommendations Have oral suction available   CHL IP FOLLOW UP RECOMMENDATIONS 11/07/2017 Follow up Recommendations (No Data)   No flowsheet data found.     CHL IP ORAL PHASE 11/08/2017 Oral Phase Impaired Oral - Pudding Teaspoon -- Oral - Pudding Cup -- Oral - Honey Teaspoon -- Oral - Honey Cup -- Oral - Nectar Teaspoon Weak lingual manipulation;Decreased bolus cohesion;Premature spillage Oral - Nectar Cup Weak lingual manipulation;Decreased bolus cohesion;Premature spillage Oral - Nectar Straw -- Oral - Thin Teaspoon Weak lingual manipulation;Decreased bolus cohesion;Premature spillage Oral - Thin Cup Weak  lingual manipulation;Lingual pumping;Decreased bolus cohesion;Premature spillage Oral - Thin Straw Weak lingual manipulation;Lingual pumping;Decreased bolus cohesion;Premature spillage Oral - Puree Weak lingual manipulation;Decreased bolus cohesion;Premature spillage Oral - Mech Soft -- Oral - Regular Impaired mastication;Weak lingual manipulation;Premature spillage Oral - Multi-Consistency -- Oral - Pill -- Oral Phase - Comment --  CHL IP PHARYNGEAL PHASE 11/08/2017 Pharyngeal Phase Impaired Pharyngeal- Pudding Teaspoon -- Pharyngeal -- Pharyngeal- Pudding Cup -- Pharyngeal -- Pharyngeal- Honey Teaspoon -- Pharyngeal -- Pharyngeal- Honey Cup -- Pharyngeal -- Pharyngeal- Nectar Teaspoon Reduced pharyngeal peristalsis;Reduced epiglottic inversion;Reduced laryngeal elevation;Reduced airway/laryngeal closure;Reduced tongue base retraction;Penetration/Aspiration during swallow;Pharyngeal residue - valleculae;Pharyngeal residue - pyriform;Lateral channel residue Pharyngeal Material enters airway, remains ABOVE vocal cords then ejected out Pharyngeal- Nectar Cup Reduced epiglottic inversion;Reduced anterior laryngeal mobility;Reduced laryngeal elevation;Reduced airway/laryngeal closure;Reduced tongue base retraction;Penetration/Aspiration during swallow;Penetration/Apiration after swallow;Pharyngeal residue - valleculae;Pharyngeal residue - pyriform;Lateral channel residue Pharyngeal Material enters airway, CONTACTS cords and then ejected out Pharyngeal- Nectar Straw Reduced pharyngeal peristalsis;Reduced epiglottic inversion;Reduced laryngeal elevation;Reduced airway/laryngeal closure;Reduced tongue base retraction;Penetration/Aspiration during swallow;Penetration/Apiration after swallow;Pharyngeal residue - valleculae;Pharyngeal residue - pyriform;Lateral channel residue Pharyngeal Material enters airway, CONTACTS cords and then ejected out Pharyngeal- Thin Teaspoon Reduced pharyngeal peristalsis;Reduced epiglottic  inversion;Reduced laryngeal elevation;Reduced airway/laryngeal closure;Reduced tongue base retraction;Penetration/Aspiration before swallow;Pharyngeal residue - valleculae;Pharyngeal residue - pyriform;Pharyngeal residue - cp segment;Lateral channel residue Pharyngeal Material enters airway, remains ABOVE vocal cords and not ejected out Pharyngeal- Thin Cup Reduced pharyngeal peristalsis;Reduced epiglottic inversion;Reduced laryngeal elevation;Reduced airway/laryngeal closure;Reduced tongue base retraction;Penetration/Aspiration before swallow;Penetration/Aspiration during swallow;Penetration/Apiration after swallow;Pharyngeal residue - valleculae;Pharyngeal residue - pyriform;Lateral channel residue Pharyngeal Material enters airway, CONTACTS cords and not ejected out;Material enters airway, passes BELOW cords then ejected out Pharyngeal- Thin Straw Reduced epiglottic inversion;Reduced laryngeal elevation;Reduced airway/laryngeal closure;Reduced tongue base retraction;Penetration/Aspiration during swallow;Penetration/Apiration after swallow;Moderate aspiration;Pharyngeal residue - valleculae;Pharyngeal residue - pyriform;Lateral channel residue Pharyngeal Material enters airway, passes BELOW cords and not ejected out despite cough attempt by patient Pharyngeal- Puree Reduced pharyngeal peristalsis;Reduced epiglottic inversion;Reduced laryngeal elevation;Reduced tongue base retraction;Pharyngeal residue - valleculae Pharyngeal -- Pharyngeal- Mechanical Soft -- Pharyngeal -- Pharyngeal- Regular Reduced pharyngeal peristalsis;Reduced epiglottic inversion;Reduced laryngeal elevation;Pharyngeal residue - valleculae Pharyngeal -- Pharyngeal- Multi-consistency -- Pharyngeal -- Pharyngeal- Pill -- Pharyngeal -- Pharyngeal Comment head turn right/left nor chin tuck helpful to decrease residuals, chin tuck worsened airway protection with thin as it spilled residuals into airway, multiple swallows helpful to decrease  residuals howevever, cued "hock" did not clear vallecular residuals - but advised pt to continue to work to strengthen this strategy  CHL IP CERVICAL ESOPHAGEAL PHASE 11/08/2017 Cervical Esophageal Phase WFL Pudding Teaspoon -- Pudding Cup -- Honey Teaspoon -- Honey Cup -- Nectar Teaspoon -- Nectar Cup -- Nectar Straw -- Thin Teaspoon -- Thin Cup -- Thin Straw -- Puree -- Mechanical Soft -- Regular -- Multi-consistency -- Pill -- Cervical Esophageal Comment -- No flowsheet data found. Luanna Salk, [facility] SLP [phone]              Dg C-arm 1-60 Min-no Report  Result Date: 11/06/2017 Fluoroscopy was utilized by the requesting physician.  No radiographic interpretation.   Dg Hip Operative Unilat With Pelvis Left  Result Date: 11/06/2017 CLINICAL DATA:  Femoral neck fracture EXAM: OPERATIVE LEFT HIP (WITH PELVIS IF PERFORMED) 2 VIEWS TECHNIQUE: Fluoroscopic spot image(s) were submitted for interpretation post-operatively. COMPARISON:  the previous day's study FINDINGS: A series of fluoroscopic spot images document placement of left hip arthroplasty hardware, projecting in expected location. No interval fracture or other apparent complication. IMPRESSION: 1. Interval left hip arthroplasty. Electronically Signed   By: Lucrezia Europe M.D.   On: 11/06/2017 10:07   Dg Femur Min 2 Views Left  Result Date: 11/05/2017 CLINICAL DATA:  Fall.  Left thigh pain. EXAM: LEFT FEMUR 2 VIEWS COMPARISON:  No recent prior. FINDINGS: Peripheral vascular calcification. Degenerative change left hip and knee. Displaced left femoral neck fracture is present. IMPRESSION: 1.  Displaced left femoral neck fracture. 2.  Degenerative changes left hip and knee. 3.  Peripheral vascular disease. Electronically Signed   By: Marcello Moores  Register   On: 11/05/2017 15:13      Subjective: Patient denies complaints.  Was sitting up this morning and getting ready to eat breakfast.  Denied pain, dyspnea, cough.  As per RN, no acute issues reported.   No bleeding reported.  Discharge Exam:  Vitals:   11/09/17 1017 11/09/17 1046 11/09/17 1319 11/09/17 1433  BP: 100/64 103/62 104/64 116/77  Pulse: (!) 111 (!) 106 99 (!) 101  Resp: 19 19 (!) 21 17  Temp: 98.7 F (37.1 C) 98.5 F (36.9 C) 98.1 F (36.7 C) 99.4 F (37.4 C)  TempSrc: Oral Oral Oral Oral  SpO2: 100% 100% 100% 97%  Weight:  Height:        General exam: Elderly male, moderately built, frail, cachectic and chronically ill looking lying comfortably propped up in bed. Respiratory system: Clear to auscultation without wheezing, rhonchi or crackles.  No increased work of breathing. Cardiovascular system: S1 & S2 heard, RRR. No JVD, murmurs, rubs, gallops or clicks. No pedal edema.   Gastrointestinal system: Abdomen is nondistended, soft and nontender. No organomegaly or masses felt. Normal bowel sounds heard.   Central nervous system: Alert and oriented x2. No focal neurological deficits.  Hoarseness + + and difficult to understand speech.  Extremities: Symmetric 5 x 5 power power in all limbs.  Left hip surgical site dressing clean and dry.   Skin: No rashes, lesions or ulcers Psychiatry: Judgement and insight appear impaired. Mood & affect appropriate.       The results of significant diagnostics from this hospitalization (including imaging, microbiology, ancillary and laboratory) are listed below for reference.     Microbiology: Recent Results (from the past 240 hour(s))  Surgical pcr screen     Status: None   Collection Time: 11/06/17 12:16 AM  Result Value Ref Range Status   MRSA, PCR NEGATIVE NEGATIVE Final   Staphylococcus aureus NEGATIVE NEGATIVE Final    Comment: (NOTE) The Xpert SA Assay (FDA approved for NASAL specimens in patients 100 years of age and older), is one component of a comprehensive surveillance program. It is not intended to diagnose infection nor to guide or monitor treatment. Performed at [facility], [place]  [address], [place],  [address]      Labs: CBC: Recent Labs  Lab 11/05/17 1640 11/07/17 0457 11/08/17 0531 11/09/17 0527 11/09/17 1512  WBC 7.7 5.5 6.5 5.2 5.2  NEUTROABS 6.4  --   --   --   --   HGB 11.2* 9.4* 8.1* 7.2* 8.1*  HCT 34.8* 29.2* 24.5* 21.8* 24.8*  MCV 89.7 89.0 87.8 88.6 89.9  PLT 180 172 170 203 259   Basic Metabolic Panel: Recent Labs  Lab 11/05/17 1640 11/07/17 0457 11/08/17 0531  NA 144 139 141  K 3.6 3.9 3.5  CL 106 100* 105  CO2 27 27 27   GLUCOSE 116* 111* 121*  BUN 14 19 30*  CREATININE 0.75 0.92 1.12  CALCIUM 9.2 8.8* 8.8*   Liver Function Tests: Recent Labs  Lab 11/05/17 1640  AST 19  ALT 14*  ALKPHOS 52  BILITOT 0.8  PROT 7.3  ALBUMIN 3.9   I discussed in detail with patient's son this morning.  Updated care and answered all questions.    Time coordinating discharge: 45 minutes  SIGNED:  Vernell Leep, MD, FACP, [facility]. [facility] Pager [phone]  If 7PM-7AM, please contact night-coverage [email or web address] Password [facility] 11/09/2017, 4:15 PM

## 2017-11-10 ENCOUNTER — Other Ambulatory Visit: Payer: Self-pay | Admitting: Radiation Oncology

## 2017-11-10 DIAGNOSIS — C3490 Malignant neoplasm of unspecified part of unspecified bronchus or lung: Secondary | ICD-10-CM

## 2017-11-10 LAB — TYPE AND SCREEN
ABO/RH(D): A POS
Antibody Screen: NEGATIVE
UNIT DIVISION: 0

## 2017-11-10 LAB — BPAM RBC
BLOOD PRODUCT EXPIRATION DATE: 201906062359
ISSUE DATE / TIME: 201905211025
Unit Type and Rh: 6200

## 2017-12-14 ENCOUNTER — Other Ambulatory Visit: Payer: Self-pay | Admitting: Radiation Oncology

## 2017-12-14 DIAGNOSIS — C3490 Malignant neoplasm of unspecified part of unspecified bronchus or lung: Secondary | ICD-10-CM

## 2017-12-22 ENCOUNTER — Telehealth: Payer: Self-pay | Admitting: *Deleted

## 2017-12-22 NOTE — Telephone Encounter (Signed)
Called patient to inform of Pet Scan for 12-27-17 - arrival time- 11:30 am @ [facility] Radiology, pt. to be NPO- 6 hrs. Prior to test, spoke with patient and he is aware of this test

## 2017-12-27 ENCOUNTER — Ambulatory Visit (HOSPITAL_COMMUNITY): Payer: Medicare PPO

## 2018-01-03 ENCOUNTER — Ambulatory Visit (HOSPITAL_COMMUNITY): Payer: PRIVATE HEALTH INSURANCE

## 2018-01-03 ENCOUNTER — Telehealth: Payer: Self-pay | Admitting: *Deleted

## 2018-01-03 NOTE — Telephone Encounter (Signed)
Called patient to ask about rescheduling PET, no answer will call later, called patient's son due to no answer on patient's phone.

## 2018-02-25 ENCOUNTER — Telehealth: Payer: Self-pay | Admitting: *Deleted

## 2018-02-25 NOTE — Telephone Encounter (Signed)
Called patient to inform of Pet Scan on 03-04-18 - arrival time- 3:30 pm @ [facility] Radiology, pt. to be  NPO- 6 hrs. Prior to test, lvm for a return call

## 2018-03-04 ENCOUNTER — Ambulatory Visit (HOSPITAL_COMMUNITY): Admission: RE | Admit: 2018-03-04 | Payer: Medicare PPO | Source: Ambulatory Visit

## 2018-03-18 ENCOUNTER — Telehealth: Payer: Self-pay | Admitting: Radiation Oncology

## 2018-03-18 NOTE — Telephone Encounter (Signed)
I spoke with Andre Silva contact at [place] Ext [phone] informed her that I have been trying to reach out to this patient to schedule him a follow up new appointment with Dr. Tammi Klippel. Patient has a voice mail that is not set and he does not answer the phone. I have also tried the son Andre Silva number it rings busy. She also tried the patient's number and got the same response. She called me back stated that she will discontinue the referral request this will flag the referring physician Dr. Carlean Jews if in the future Dr. Neita Carp decided to re-refer the patient they will send the request to [facility] again.

## 2018-04-25 ENCOUNTER — Encounter (HOSPITAL_COMMUNITY): Payer: Self-pay | Admitting: Emergency Medicine

## 2018-04-25 ENCOUNTER — Emergency Department (HOSPITAL_COMMUNITY): Payer: Medicare PPO

## 2018-04-25 ENCOUNTER — Emergency Department (HOSPITAL_COMMUNITY)
Admission: EM | Admit: 2018-04-25 | Discharge: 2018-04-25 | Disposition: A | Discharge status: Home / Self Care | Payer: Medicare PPO | Attending: Emergency Medicine | Admitting: Emergency Medicine

## 2018-04-25 DIAGNOSIS — R4182 Altered mental status, unspecified: Secondary | ICD-10-CM | POA: Diagnosis present

## 2018-04-25 DIAGNOSIS — F17210 Nicotine dependence, cigarettes, uncomplicated: Secondary | ICD-10-CM | POA: Insufficient documentation

## 2018-04-25 DIAGNOSIS — Z79899 Other long term (current) drug therapy: Secondary | ICD-10-CM | POA: Insufficient documentation

## 2018-04-25 DIAGNOSIS — Z7982 Long term (current) use of aspirin: Secondary | ICD-10-CM | POA: Diagnosis not present

## 2018-04-25 DIAGNOSIS — Z85118 Personal history of other malignant neoplasm of bronchus and lung: Secondary | ICD-10-CM | POA: Diagnosis not present

## 2018-04-25 DIAGNOSIS — I10 Essential (primary) hypertension: Secondary | ICD-10-CM | POA: Insufficient documentation

## 2018-04-25 DIAGNOSIS — F0391 Unspecified dementia with behavioral disturbance: Secondary | ICD-10-CM | POA: Diagnosis not present

## 2018-04-25 LAB — COMPREHENSIVE METABOLIC PANEL
ALBUMIN: 4.2 g/dL (ref 3.5–5.0)
ALK PHOS: 75 U/L (ref 38–126)
ALT: 11 U/L (ref 0–44)
AST: 27 U/L (ref 15–41)
Anion gap: 8 (ref 5–15)
BUN: 9 mg/dL (ref 8–23)
CO2: 30 mmol/L (ref 22–32)
CREATININE: 0.8 mg/dL (ref 0.61–1.24)
Calcium: 9.5 mg/dL (ref 8.9–10.3)
Chloride: 101 mmol/L (ref 98–111)
GFR calc Af Amer: >60 mL/min (ref >60)
GFR calc non Af Amer: >60 mL/min (ref >60)
GLUCOSE: 100 mg/dL — AB (ref 70–99)
Potassium: 3.9 mmol/L (ref 3.5–5.1)
SODIUM: 139 mmol/L (ref 135–145)
Total Bilirubin: 1.2 mg/dL (ref 0.3–1.2)
Total Protein: 8.3 g/dL — ABNORMAL HIGH (ref 6.5–8.1)

## 2018-04-25 LAB — CBC WITH DIFFERENTIAL/PLATELET
Abs Immature Granulocytes: 0.01 10*3/uL (ref 0.00–0.07)
BASOS PCT: 1 %
Basophils Absolute: 0 10*3/uL (ref 0.0–0.1)
EOS ABS: 0 10*3/uL (ref 0.0–0.5)
Eosinophils Relative: 1 %
HEMATOCRIT: 37.1 % — AB (ref 39.0–52.0)
Hemoglobin: 11.2 g/dL — ABNORMAL LOW (ref 13.0–17.0)
Immature Granulocytes: 0 %
LYMPHS ABS: 1.1 10*3/uL (ref 0.7–4.0)
Lymphocytes Relative: 27 %
MCH: 27 pg (ref 26.0–34.0)
MCHC: 30.2 g/dL (ref 30.0–36.0)
MCV: 89.4 fL (ref 80.0–100.0)
MONO ABS: 0.3 10*3/uL (ref 0.1–1.0)
MONOS PCT: 7 %
Neutro Abs: 2.7 10*3/uL (ref 1.7–7.7)
Neutrophils Relative %: 64 %
PLATELETS: 243 10*3/uL (ref 150–400)
RBC: 4.15 MIL/uL — ABNORMAL LOW (ref 4.22–5.81)
RDW: 18.7 % — ABNORMAL HIGH (ref 11.5–15.5)
WBC: 4.2 10*3/uL (ref 4.0–10.5)
nRBC: 0 % (ref 0.0–0.2)

## 2018-04-25 LAB — I-STAT TROPONIN, ED: Troponin i, poc: 0.01 ng/mL (ref 0.00–0.08)

## 2018-04-25 LAB — ETHANOL: Alcohol, Ethyl (B): <10 mg/dL (ref <10)

## 2018-04-25 NOTE — ED Notes (Signed)
Pt. Notified he needs to get into a gown and get hooked up to monitor, pt. Stated he just did all that and does not need to do all that. Pt. Refused to put on gown and be hooked up to monitor.

## 2018-04-25 NOTE — ED Notes (Signed)
Pt's son contacted at 70 and he states he lives at home with GF Elzie Rings. Still drives and cuts his own grass. Very independent. He states he is now in [place] but does know he takes a drink during the day. He instructed RN to call Janell. RN attempted to call her but got busy signal.

## 2018-04-25 NOTE — ED Notes (Signed)
Pt back from CT

## 2018-04-25 NOTE — ED Notes (Addendum)
Pt found walking out of ED with bags and EKG leads still attached. Pt was brought back to his room by another tech. Pt said that he is ready to get out of here.

## 2018-04-25 NOTE — ED Provider Notes (Signed)
[facility] EMERGENCY DEPARTMENT Provider Note   CSN: [id number] Arrival date & time: 04/25/18  1430     History   Chief Complaint Chief Complaint  Patient presents with  . Altered Mental Status    HPI Andre Silva is a 83 y.o. male.  The history is provided by the patient, the EMS personnel and medical records. No language interpreter was used.   Andre Silva is a 83 y.o. male who presents to the Emergency Department complaining of AMS. Patient is brought in by EMS for altered mental status. [employer] saw him walking and called EMS this patient appeared confused. Patient denies any complaints and wishes to go home. He denies any headache, chest pain, shortness of breath, falls. Level V caveat due to confusion.  Past Medical History:  Diagnosis Date  . Absent sense of smell   . Corns and callosities   . Hypertension   . Intermittent cerebral ischemia   . Knee joint pain   . Low back pain   . Lung cancer ([place])   . Problems with swallowing and mastication   . Skin cyst   . Unspecified urinary incontinence     Patient Active Problem List   Diagnosis Date Noted  . Protein-calorie malnutrition, severe 11/07/2017  . Closed left hip fracture, initial encounter ([place]) 11/05/2017  . HLD (hyperlipidemia) 11/05/2017  . Presumed primary cancer of left lower lobe of lung ([place]) 10/15/2016  . Primary cancer of right upper lobe of lung ([place]) 04/05/2015  . HTN (hypertension) 09/18/2013  . Syncope and collapse 09/18/2013  . TIA (transient ischemic attack) 09/17/2013    Past Surgical History:  Procedure Laterality Date  . LOBECTOMY Right 04/15/2015   RUL lobectomy  . TOTAL HIP ARTHROPLASTY Left 11/06/2017   Procedure: TOTAL HIP ARTHROPLASTY ANTERIOR APPROACH;  Surgeon: Mcarthur Rossetti, MD;  Location: [facility] ORS;  Service: Orthopedics;  Laterality: Left;        Home Medications    Prior to Admission medications   Medication Sig Start Date End Date  Taking? Authorizing Provider  aspirin 81 MG chewable tablet Chew 1 tablet (81 mg total) by mouth 2 (two) times daily after a meal. 11/09/17   Mcarthur Rossetti, MD  atorvastatin (LIPITOR) 40 MG tablet Take 40 mg by mouth at bedtime.    [provider]  cetirizine (ZYRTEC) 10 MG tablet Take 10 mg by mouth daily.    [provider]  HYDROcodone-acetaminophen (NORCO) 5-325 MG tablet Take 1-2 tablets by mouth every 6 (six) hours as needed for moderate pain. 11/09/17   Mcarthur Rossetti, MD  metoprolol tartrate (LOPRESSOR) 50 MG tablet Take 25 mg by mouth 2 (two) times daily.    [provider]  Omega-3 Fatty Acids (FISH OIL PO) Take 1 capsule by mouth daily.     [provider]  pantoprazole (PROTONIX) 40 MG tablet Take 40 mg by mouth 2 (two) times daily.    [provider]  ranitidine (ZANTAC) 300 MG tablet Take 300 mg by mouth at bedtime.    [provider]  tamsulosin (FLOMAX) 0.4 MG CAPS capsule Take 0.4 mg by mouth daily.    [provider]  zinc gluconate 50 MG tablet Take 50 mg by mouth daily.    [provider]    Family History Family History  Problem Relation Age of Onset  . Breast cancer Mother     Social History Social History   Tobacco Use  . Smoking  status: Current Every Day Smoker    Packs/day: 0.25    Years: 68.00    Pack years: 17.00    Types: Cigarettes  . Smokeless tobacco: Never Used  Substance Use Topics  . Alcohol use: Yes    Alcohol/week: 6.0 standard drinks    Types: 6 Cans of beer per week    Comment: Six pack of beer per week  . Drug use: No     Allergies   Patient has no known allergies.   Review of Systems Review of Systems  All other systems reviewed and are negative.    Physical Exam Updated Vital Signs BP 118/80 (BP Location: Left Arm)   Pulse 95   Temp (!) 97.5 F (36.4 C) (Oral)   Resp 16   SpO2 100%   Physical Exam  Constitutional: He appears  well-developed.  Cachectic, disheveled  HENT:  Head: Normocephalic and atraumatic.  edentulous  Cardiovascular: Normal rate and regular rhythm.  No murmur heard. Pulmonary/Chest: Effort normal and breath sounds normal. No respiratory distress.  Abdominal: Soft. There is no tenderness. There is no rebound and no guarding.  Musculoskeletal: He exhibits no edema or tenderness.  Neurological: He is alert.  Disoriented to time. Oriented to recent events. Mildly confused. Five out of five strength in all four extremities. No pronated or drift. Visual fields are grossly intact. On initial assessment he had ataxic gait but on repeat assessment he has steady gait. dysarthritic speech  Skin: Skin is warm and dry.  Psychiatric: He has a normal mood and affect. His behavior is normal.  Nursing note and vitals reviewed.    ED Treatments / Results  Labs (all labs ordered are listed, but only abnormal results are displayed) Labs Reviewed  COMPREHENSIVE METABOLIC PANEL - Abnormal; Notable for the following components:      Result Value   Glucose, Bld 100 (*)    Total Protein 8.3 (*)    All other components within normal limits  CBC WITH DIFFERENTIAL/PLATELET - Abnormal; Notable for the following components:   RBC 4.15 (*)    Hemoglobin 11.2 (*)    HCT 37.1 (*)    RDW 18.7 (*)    All other components within normal limits  ETHANOL  URINALYSIS, ROUTINE W REFLEX MICROSCOPIC  RAPID URINE DRUG SCREEN, HOSP PERFORMED  I-STAT TROPONIN, ED    EKG EKG Interpretation  Date/Time:  Monday April 25 2018 16:05:16 EST Ventricular Rate:  95 PR Interval:    QRS Duration: 96 QT Interval:  383 QTC Calculation: 479 R Axis:   -63 Text Interpretation:  Sinus rhythm Left anterior fascicular block Abnormal R-wave progression, early transition Left ventricular hypertrophy ST elevation, consider anterior injury Borderline prolonged QT interval Artifact in lead(s) I III aVL V5 and baseline wander in lead(s)  V3 Confirmed by Quintella Reichert [phone]) on 04/25/2018 4:07:46 PM   Radiology Ct Head Wo Contrast  Result Date: 04/25/2018 CLINICAL DATA:  83 year old male with altered mental status onset today. History of lung cancer. EXAM: CT HEAD WITHOUT CONTRAST TECHNIQUE: Contiguous axial images were obtained from the base of the skull through the vertex without intravenous contrast. COMPARISON:  Outside [facility] brain MRI 10/15/2016. Head CT without contrast 09/17/2013. FINDINGS: Brain: No midline shift, ventriculomegaly, mass effect, evidence of mass lesion, intracranial hemorrhage or evidence of cortically based acute infarction. Patchy chronic cerebral white matter hypodensity appears not significantly changed. No cortical encephalomalacia identified. Mild heterogeneity in the deep gray matter nuclei has increased since 2015 but appears  similar to the prior MRI. Vascular: Calcified atherosclerosis at the skull base. No suspicious intracranial vascular hyperdensity. Skull: Stable, negative. Sinuses/Orbits: Visualized paranasal sinuses and mastoids are stable and generally well pneumatized. Other: No acute orbit or scalp soft tissue findings. IMPRESSION: 1. No acute intracranial abnormality identified. 2. Chronic small vessel disease with progression since the 2015 CT. Electronically Signed   By: Genevie Ann M.D.   On: 04/25/2018 16:33    Procedures Procedures (including critical care time)  Medications Ordered in ED Medications - No data to display   Initial Impression / Assessment and Plan / ED Course  I have reviewed the triage vital signs and the nursing notes.  Pertinent labs & imaging results that were available during my care of the patient were reviewed by me and considered in my medical decision making (see chart for details).     Patient brought in by EMS after being found walking on the street. He is disoriented to time as well as his home address. He is a dysarthria on speaking and he states  this is due to not having teeth. Contacted patient's son and daughter and they agree that his speech is chronic and that he does have some ongoing confusion. Case management and social work involved and they were able to confirm that his home is safe. APS involved. Presentation is not consistent with intoxication, CVA, acute bacterial illness or acute delirium. Plan to discharge home with outpatient follow-up.  Final Clinical Impressions(s) / ED Diagnoses   Final diagnoses:  Dementia with behavioral disturbance, unspecified dementia type [facility])    ED Discharge Orders    None       Quintella Reichert, MD 04/25/18 2319

## 2018-04-25 NOTE — ED Triage Notes (Addendum)
Pt walking down street by himself and [employer] called EMS. Pt denies pain and has no complaints. CBG 107- no distress noted. Cannot or will not tell his name or where he lives States "he doesn't know or doesn't care"

## 2018-04-25 NOTE — ED Notes (Signed)
Andre Silva EQAS [phone]  (oldest daughter)

## 2018-04-25 NOTE — ED Notes (Addendum)
Pt has been incontinent of urine and refusing to get changed.

## 2018-04-25 NOTE — Progress Notes (Addendum)
CSW attempted to call pt's significant other Janell at [phone], phone disconnected. CSW called Janell's home number at [phone]. CSW left HIPAA complaint voicemail. CSW and other staff made multiple attempts to contact Janell. Pt's family aware that hospital trying to contact [place], significant other.   CSW called non emergency [employer] for a wellness check at address listed for pt.   Update: CSW received phone call back from [place], pt's significant other and roommate. Janell stated she is at the house and waiting for pt to return. [place] phone number is [phone]. CSW provided taxi voucher for pt to return home with.   Wendelyn Breslow, [facility]  [phone]

## 2018-04-25 NOTE — ED Notes (Signed)
Patient transported to CT 

## 2018-04-25 NOTE — ED Notes (Signed)
Pt trying to leave again, stating that he's "been here for two days and is ready to go home." This tech explained that we are trying to get him home safely but that he'll have to wait for a little bit. Pt was not happy about staying but is compliant at this time.

## 2018-04-26 ENCOUNTER — Telehealth: Payer: Self-pay | Admitting: *Deleted

## 2018-04-26 NOTE — Telephone Encounter (Signed)
CALLED PATIENT'S [facility] TO ASK ABOUT SETTING UP PET SCAN, LVM FOR A RETURN CALL

## 2018-05-02 ENCOUNTER — Encounter (HOSPITAL_COMMUNITY): Payer: Self-pay

## 2018-05-02 ENCOUNTER — Emergency Department (HOSPITAL_COMMUNITY)
Admission: EM | Admit: 2018-05-02 | Discharge: 2018-05-02 | Disposition: A | Discharge status: Home / Self Care | Payer: Non-veteran care | Attending: Emergency Medicine | Admitting: Emergency Medicine

## 2018-05-02 DIAGNOSIS — F17210 Nicotine dependence, cigarettes, uncomplicated: Secondary | ICD-10-CM | POA: Insufficient documentation

## 2018-05-02 DIAGNOSIS — Z7982 Long term (current) use of aspirin: Secondary | ICD-10-CM | POA: Insufficient documentation

## 2018-05-02 DIAGNOSIS — F0150 Vascular dementia without behavioral disturbance: Secondary | ICD-10-CM | POA: Diagnosis not present

## 2018-05-02 DIAGNOSIS — I10 Essential (primary) hypertension: Secondary | ICD-10-CM | POA: Diagnosis not present

## 2018-05-02 DIAGNOSIS — Z96642 Presence of left artificial hip joint: Secondary | ICD-10-CM | POA: Diagnosis not present

## 2018-05-02 DIAGNOSIS — R410 Disorientation, unspecified: Secondary | ICD-10-CM | POA: Diagnosis present

## 2018-05-02 LAB — URINALYSIS, ROUTINE W REFLEX MICROSCOPIC
Bilirubin Urine: NEGATIVE
Glucose, UA: 50 mg/dL — AB
Hgb urine dipstick: NEGATIVE
KETONES UR: NEGATIVE mg/dL
LEUKOCYTES UA: NEGATIVE
NITRITE: NEGATIVE
PH: 5 (ref 5.0–8.0)
Protein, ur: NEGATIVE mg/dL
Specific Gravity, Urine: 1.026 (ref 1.005–1.030)

## 2018-05-02 LAB — CBC WITH DIFFERENTIAL/PLATELET
Abs Immature Granulocytes: 0.01 10*3/uL (ref 0.00–0.07)
BASOS ABS: 0 10*3/uL (ref 0.0–0.1)
Basophils Relative: 1 %
Eosinophils Absolute: 0 10*3/uL (ref 0.0–0.5)
Eosinophils Relative: 1 %
HEMATOCRIT: 31.8 % — AB (ref 39.0–52.0)
HEMOGLOBIN: 9.8 g/dL — AB (ref 13.0–17.0)
IMMATURE GRANULOCYTES: 0 %
LYMPHS ABS: 1.3 10*3/uL (ref 0.7–4.0)
Lymphocytes Relative: 30 %
MCH: 28.5 pg (ref 26.0–34.0)
MCHC: 30.8 g/dL (ref 30.0–36.0)
MCV: 92.4 fL (ref 80.0–100.0)
Monocytes Absolute: 0.3 10*3/uL (ref 0.1–1.0)
Monocytes Relative: 7 %
NEUTROS ABS: 2.7 10*3/uL (ref 1.7–7.7)
NEUTROS PCT: 61 %
NRBC: 0 % (ref 0.0–0.2)
Platelets: 202 10*3/uL (ref 150–400)
RBC: 3.44 MIL/uL — ABNORMAL LOW (ref 4.22–5.81)
RDW: 19 % — AB (ref 11.5–15.5)
WBC: 4.4 10*3/uL (ref 4.0–10.5)

## 2018-05-02 LAB — BASIC METABOLIC PANEL
ANION GAP: 8 (ref 5–15)
BUN: 17 mg/dL (ref 8–23)
CALCIUM: 8.9 mg/dL (ref 8.9–10.3)
CHLORIDE: 105 mmol/L (ref 98–111)
CO2: 30 mmol/L (ref 22–32)
Creatinine, Ser: 0.92 mg/dL (ref 0.61–1.24)
GFR calc non Af Amer: >60 mL/min (ref >60)
Glucose, Bld: 86 mg/dL (ref 70–99)
Potassium: 3.5 mmol/L (ref 3.5–5.1)
Sodium: 143 mmol/L (ref 135–145)

## 2018-05-02 NOTE — ED Notes (Addendum)
Spoke with patients daughter. Daughter will call a taxi for the patient. Daughter was not happy about this. Daughter may or may not call taxi. Daughter asking where patients [employer] is. I have called EMS to find out where patients [employer] is. EMS picked patient up at his [place].

## 2018-05-02 NOTE — ED Notes (Signed)
[facility] VOUCHER IN BIN WITH DISCHARGE PAPERS.

## 2018-05-02 NOTE — ED Notes (Signed)
Patient trying to get out of the bed. Patient states he wants to go home. Allen,MD notified.

## 2018-05-02 NOTE — ED Notes (Signed)
Bed: [facility] Expected date:  Expected time:  Means of arrival:  Comments: Ems dementia

## 2018-05-02 NOTE — ED Provider Notes (Addendum)
[facility] DEPT Provider Note   CSN: [id number] Arrival date & time: 05/02/18  1637     History   Chief Complaint Chief Complaint  Patient presents with  . Dementia    HPI Andre Silva is a 83 y.o. male.  83 year old male with history of dementia presents after being found in his [place] by bystanders.  Patient had a visit to the ED about a week ago for similar issues of being found confused.  Review of the old record shows that the patient lives with a significant other.  I spoke to her on the phone who states that the patient still drives a Lucianne Lei and that he has not had any recent health issues.  The patient himself denies any somatic complaints at this time.  Presents via EMS     Past Medical History:  Diagnosis Date  . Absent sense of smell   . Corns and callosities   . Hypertension   . Intermittent cerebral ischemia   . Knee joint pain   . Low back pain   . Lung cancer ([place])   . Problems with swallowing and mastication   . Skin cyst   . Unspecified urinary incontinence     Patient Active Problem List   Diagnosis Date Noted  . Protein-calorie malnutrition, severe 11/07/2017  . Closed left hip fracture, initial encounter ([place]) 11/05/2017  . HLD (hyperlipidemia) 11/05/2017  . Presumed primary cancer of left lower lobe of lung ([place]) 10/15/2016  . Primary cancer of right upper lobe of lung ([place]) 04/05/2015  . HTN (hypertension) 09/18/2013  . Syncope and collapse 09/18/2013  . TIA (transient ischemic attack) 09/17/2013    Past Surgical History:  Procedure Laterality Date  . LOBECTOMY Right 04/15/2015   RUL lobectomy  . TOTAL HIP ARTHROPLASTY Left 11/06/2017   Procedure: TOTAL HIP ARTHROPLASTY ANTERIOR APPROACH;  Surgeon: Mcarthur Rossetti, MD;  Location: [facility] ORS;  Service: Orthopedics;  Laterality: Left;        Home Medications    Prior to Admission medications   Medication Sig Start Date End Date Taking? Authorizing  Provider  aspirin 81 MG chewable tablet Chew 1 tablet (81 mg total) by mouth 2 (two) times daily after a meal. 11/09/17   Mcarthur Rossetti, MD  atorvastatin (LIPITOR) 40 MG tablet Take 40 mg by mouth at bedtime.    [provider]  cetirizine (ZYRTEC) 10 MG tablet Take 10 mg by mouth daily.    [provider]  HYDROcodone-acetaminophen (NORCO) 5-325 MG tablet Take 1-2 tablets by mouth every 6 (six) hours as needed for moderate pain. 11/09/17   Mcarthur Rossetti, MD  metoprolol tartrate (LOPRESSOR) 50 MG tablet Take 25 mg by mouth 2 (two) times daily.    [provider]  Omega-3 Fatty Acids (FISH OIL PO) Take 1 capsule by mouth daily.     [provider]  pantoprazole (PROTONIX) 40 MG tablet Take 40 mg by mouth 2 (two) times daily.    [provider]  ranitidine (ZANTAC) 300 MG tablet Take 300 mg by mouth at bedtime.    [provider]  tamsulosin (FLOMAX) 0.4 MG CAPS capsule Take 0.4 mg by mouth daily.    [provider]  zinc gluconate 50 MG tablet Take 50 mg by mouth daily.    [provider]    Family History Family History  Problem Relation Age of Onset  . Breast cancer Mother     Social History Social  History   Tobacco Use  . Smoking status: Current Every Day Smoker    Packs/day: 0.25    Years: 68.00    Pack years: 17.00    Types: Cigarettes  . Smokeless tobacco: Never Used  Substance Use Topics  . Alcohol use: Yes    Alcohol/week: 6.0 standard drinks    Types: 6 Cans of beer per week    Comment: Six pack of beer per week  . Drug use: No     Allergies   Patient has no known allergies.   Review of Systems Review of Systems  Unable to perform ROS: Dementia     Physical Exam Updated Vital Signs BP 135/83 (BP Location: Left Arm)   Pulse 90   Temp 98.3 F (36.8 C) (Oral)   Resp 16   SpO2 100%   Physical Exam  Constitutional: He appears well-developed and well-nourished.   Non-toxic appearance. No distress.  HENT:  Head: Normocephalic and atraumatic.  Eyes: Pupils are equal, round, and reactive to light. Conjunctivae, EOM and lids are normal.  Neck: Normal range of motion. Neck supple. No tracheal deviation present. No thyroid mass present.  Cardiovascular: Normal rate, regular rhythm and normal heart sounds. Exam reveals no gallop.  No murmur heard. Pulmonary/Chest: Effort normal and breath sounds normal. No stridor. No respiratory distress. He has no decreased breath sounds. He has no wheezes. He has no rhonchi. He has no rales.  Abdominal: Soft. Normal appearance and bowel sounds are normal. He exhibits no distension. There is no tenderness. There is no rebound and no CVA tenderness.  Musculoskeletal: Normal range of motion. He exhibits no edema or tenderness.  Neurological: He is alert. He has normal strength. He is disoriented. He displays no atrophy and no tremor. No cranial nerve deficit or sensory deficit. GCS eye subscore is 4. GCS verbal subscore is 5. GCS motor subscore is 6.  Skin: Skin is warm and dry. No abrasion and no rash noted.  Psychiatric: His speech is normal and behavior is normal. His affect is blunt.  Nursing note and vitals reviewed.    ED Treatments / Results  Labs (all labs ordered are listed, but only abnormal results are displayed) Labs Reviewed  URINE CULTURE  CBC WITH DIFFERENTIAL/PLATELET  BASIC METABOLIC PANEL  URINALYSIS, ROUTINE W REFLEX MICROSCOPIC    EKG None  Radiology No results found.  Procedures Procedures (including critical care time)  Medications Ordered in ED Medications - No data to display   Initial Impression / Assessment and Plan / ED Course  I have reviewed the triage vital signs and the nursing notes.  Pertinent labs & imaging results that were available during my care of the patient were reviewed by me and considered in my medical decision making (see chart for details).     Patient is  medical work-up here without acute findings.  Discuss with patient's significant other and she is agreeable to taking him home.  I did speak about what long-term plans that she or his family had for potential placement and she is unsure.  Patient is awake and alert and oriented x3 at this time.  Appears to be stable to be sent home in a tab as this is what was done the last time he was here.  Final Clinical Impressions(s) / ED Diagnoses   Final diagnoses:  None    ED Discharge Orders    None       Lacretia Leigh, MD 05/02/18 Kerby Moors,  Elberta Fortis, MD 05/02/18 1836

## 2018-05-02 NOTE — ED Notes (Signed)
Spoke to social work. They are working on getting a taxi for patient and a Chief Strategy Officer. Tried to call patients daughter multiple times with no answer.

## 2018-05-02 NOTE — ED Triage Notes (Signed)
Patient arrived by EMS was found on the side of the road sleeping in vehicle. Pt needs to be evaluated by hospital per EMS. Pt has hx of dementia. Pt is not alert and oriented to person, place, situation, or time.  Patient VS [facility] per EMS.

## 2018-05-02 NOTE — Progress Notes (Signed)
CSW received a request from [employer] for a taxi voucher.  CSW reviewed chart and sees pt has HX of dementia.  CSW attempted to call pt's family without success.  RN states pt is unsafe to remain in waiting room due to dementia.  CSW provided taxi voucher to RN and called [employer].  CSW will continue to follow for D/C needs.  Alphonse Guild. Dorothymae Maciver, LCSW, LCAS, CSI Clinical Social Worker Ph: [phone]

## 2018-05-02 NOTE — ED Notes (Signed)
Spoke to daughter. Daughter is aware patient will be coming home in a taxi with a voucher.

## 2018-05-02 NOTE — ED Notes (Signed)
Patient being cooperative. Patient eating sandwich.

## 2018-05-04 LAB — URINE CULTURE

## 2018-06-17 ENCOUNTER — Encounter (HOSPITAL_COMMUNITY): Payer: Self-pay | Admitting: Emergency Medicine

## 2018-06-17 ENCOUNTER — Emergency Department (HOSPITAL_COMMUNITY): Payer: No Typology Code available for payment source

## 2018-06-17 ENCOUNTER — Inpatient Hospital Stay (HOSPITAL_COMMUNITY)
Admission: EM | Admit: 2018-06-17 | Discharge: 2018-06-22 | DRG: 208 | Disposition: E | Payer: No Typology Code available for payment source | Attending: Pulmonary Disease | Admitting: Pulmonary Disease

## 2018-06-17 ENCOUNTER — Inpatient Hospital Stay (HOSPITAL_COMMUNITY): Payer: No Typology Code available for payment source

## 2018-06-17 DIAGNOSIS — M545 Low back pain: Secondary | ICD-10-CM | POA: Diagnosis present

## 2018-06-17 DIAGNOSIS — T4275XA Adverse effect of unspecified antiepileptic and sedative-hypnotic drugs, initial encounter: Secondary | ICD-10-CM | POA: Diagnosis not present

## 2018-06-17 DIAGNOSIS — J439 Emphysema, unspecified: Secondary | ICD-10-CM | POA: Diagnosis present

## 2018-06-17 DIAGNOSIS — G40901 Epilepsy, unspecified, not intractable, with status epilepticus: Secondary | ICD-10-CM | POA: Diagnosis present

## 2018-06-17 DIAGNOSIS — R9401 Abnormal electroencephalogram [EEG]: Secondary | ICD-10-CM | POA: Diagnosis present

## 2018-06-17 DIAGNOSIS — E870 Hyperosmolality and hypernatremia: Secondary | ICD-10-CM | POA: Diagnosis present

## 2018-06-17 DIAGNOSIS — Z7982 Long term (current) use of aspirin: Secondary | ICD-10-CM

## 2018-06-17 DIAGNOSIS — J690 Pneumonitis due to inhalation of food and vomit: Secondary | ICD-10-CM | POA: Diagnosis present

## 2018-06-17 DIAGNOSIS — E876 Hypokalemia: Secondary | ICD-10-CM | POA: Diagnosis not present

## 2018-06-17 DIAGNOSIS — R402212 Coma scale, best verbal response, none, at arrival to emergency department: Secondary | ICD-10-CM | POA: Diagnosis present

## 2018-06-17 DIAGNOSIS — F0390 Unspecified dementia without behavioral disturbance: Secondary | ICD-10-CM | POA: Diagnosis present

## 2018-06-17 DIAGNOSIS — Y92239 Unspecified place in hospital as the place of occurrence of the external cause: Secondary | ICD-10-CM | POA: Diagnosis not present

## 2018-06-17 DIAGNOSIS — G931 Anoxic brain damage, not elsewhere classified: Secondary | ICD-10-CM | POA: Diagnosis present

## 2018-06-17 DIAGNOSIS — Z681 Body mass index (BMI) 19 or less, adult: Secondary | ICD-10-CM

## 2018-06-17 DIAGNOSIS — R490 Dysphonia: Secondary | ICD-10-CM | POA: Diagnosis present

## 2018-06-17 DIAGNOSIS — I498 Other specified cardiac arrhythmias: Secondary | ICD-10-CM | POA: Diagnosis present

## 2018-06-17 DIAGNOSIS — E43 Unspecified severe protein-calorie malnutrition: Secondary | ICD-10-CM | POA: Diagnosis present

## 2018-06-17 DIAGNOSIS — R402312 Coma scale, best motor response, none, at arrival to emergency department: Secondary | ICD-10-CM | POA: Diagnosis present

## 2018-06-17 DIAGNOSIS — Z79891 Long term (current) use of opiate analgesic: Secondary | ICD-10-CM

## 2018-06-17 DIAGNOSIS — R509 Fever, unspecified: Secondary | ICD-10-CM | POA: Diagnosis not present

## 2018-06-17 DIAGNOSIS — E878 Other disorders of electrolyte and fluid balance, not elsewhere classified: Secondary | ICD-10-CM | POA: Diagnosis present

## 2018-06-17 DIAGNOSIS — M6250 Muscle wasting and atrophy, not elsewhere classified, unspecified site: Secondary | ICD-10-CM | POA: Diagnosis present

## 2018-06-17 DIAGNOSIS — R402112 Coma scale, eyes open, never, at arrival to emergency department: Secondary | ICD-10-CM | POA: Diagnosis present

## 2018-06-17 DIAGNOSIS — E785 Hyperlipidemia, unspecified: Secondary | ICD-10-CM | POA: Diagnosis present

## 2018-06-17 DIAGNOSIS — Z66 Do not resuscitate: Secondary | ICD-10-CM | POA: Diagnosis not present

## 2018-06-17 DIAGNOSIS — Z85118 Personal history of other malignant neoplasm of bronchus and lung: Secondary | ICD-10-CM

## 2018-06-17 DIAGNOSIS — I959 Hypotension, unspecified: Secondary | ICD-10-CM | POA: Diagnosis not present

## 2018-06-17 DIAGNOSIS — G8929 Other chronic pain: Secondary | ICD-10-CM | POA: Diagnosis present

## 2018-06-17 DIAGNOSIS — R64 Cachexia: Secondary | ICD-10-CM | POA: Diagnosis present

## 2018-06-17 DIAGNOSIS — R680 Hypothermia, not associated with low environmental temperature: Secondary | ICD-10-CM | POA: Diagnosis present

## 2018-06-17 DIAGNOSIS — J9601 Acute respiratory failure with hypoxia: Principal | ICD-10-CM | POA: Diagnosis present

## 2018-06-17 DIAGNOSIS — Z96642 Presence of left artificial hip joint: Secondary | ICD-10-CM | POA: Diagnosis present

## 2018-06-17 DIAGNOSIS — I69391 Dysphagia following cerebral infarction: Secondary | ICD-10-CM

## 2018-06-17 DIAGNOSIS — N179 Acute kidney failure, unspecified: Secondary | ICD-10-CM | POA: Diagnosis present

## 2018-06-17 DIAGNOSIS — Z972 Presence of dental prosthetic device (complete) (partial): Secondary | ICD-10-CM

## 2018-06-17 DIAGNOSIS — I469 Cardiac arrest, cause unspecified: Secondary | ICD-10-CM | POA: Diagnosis present

## 2018-06-17 DIAGNOSIS — Z978 Presence of other specified devices: Secondary | ICD-10-CM

## 2018-06-17 DIAGNOSIS — I9589 Other hypotension: Secondary | ICD-10-CM | POA: Diagnosis not present

## 2018-06-17 DIAGNOSIS — Z79899 Other long term (current) drug therapy: Secondary | ICD-10-CM

## 2018-06-17 DIAGNOSIS — Z803 Family history of malignant neoplasm of breast: Secondary | ICD-10-CM

## 2018-06-17 DIAGNOSIS — J9600 Acute respiratory failure, unspecified whether with hypoxia or hypercapnia: Secondary | ICD-10-CM | POA: Diagnosis not present

## 2018-06-17 DIAGNOSIS — D649 Anemia, unspecified: Secondary | ICD-10-CM | POA: Diagnosis present

## 2018-06-17 DIAGNOSIS — Z902 Acquired absence of lung [part of]: Secondary | ICD-10-CM

## 2018-06-17 DIAGNOSIS — Z515 Encounter for palliative care: Secondary | ICD-10-CM | POA: Diagnosis not present

## 2018-06-17 DIAGNOSIS — I10 Essential (primary) hypertension: Secondary | ICD-10-CM | POA: Diagnosis present

## 2018-06-17 DIAGNOSIS — F17210 Nicotine dependence, cigarettes, uncomplicated: Secondary | ICD-10-CM | POA: Diagnosis present

## 2018-06-17 LAB — CBC WITH DIFFERENTIAL/PLATELET
Abs Immature Granulocytes: 0 10*3/uL (ref 0.00–0.07)
BASOS PCT: 0 %
Basophils Absolute: 0 10*3/uL (ref 0.0–0.1)
Eosinophils Absolute: 0 10*3/uL (ref 0.0–0.5)
Eosinophils Relative: 0 %
HCT: 34.3 % — ABNORMAL LOW (ref 39.0–52.0)
HEMOGLOBIN: 10.3 g/dL — AB (ref 13.0–17.0)
LYMPHS PCT: 11 %
Lymphs Abs: 0.5 10*3/uL — ABNORMAL LOW (ref 0.7–4.0)
MCH: 27.2 pg (ref 26.0–34.0)
MCHC: 30 g/dL (ref 30.0–36.0)
MCV: 90.7 fL (ref 80.0–100.0)
MONO ABS: 0 10*3/uL — AB (ref 0.1–1.0)
MONOS PCT: 0 %
NEUTROS ABS: 3.7 10*3/uL (ref 1.7–7.7)
NEUTROS PCT: 89 %
NRBC: 0 /100{WBCs}
PLATELETS: 269 10*3/uL (ref 150–400)
RBC: 3.78 MIL/uL — AB (ref 4.22–5.81)
RDW: 18.8 % — ABNORMAL HIGH (ref 11.5–15.5)
WBC: 4.2 10*3/uL (ref 4.0–10.5)
nRBC: 0 % (ref 0.0–0.2)

## 2018-06-17 LAB — TYPE AND SCREEN
ABO/RH(D): A POS
Antibody Screen: NEGATIVE

## 2018-06-17 LAB — I-STAT CHEM 8, ED
BUN: 36 mg/dL — AB (ref 8–23)
CALCIUM ION: 0.95 mmol/L — AB (ref 1.15–1.40)
CHLORIDE: 117 mmol/L — AB (ref 98–111)
Creatinine, Ser: 1.1 mg/dL (ref 0.61–1.24)
Glucose, Bld: 110 mg/dL — ABNORMAL HIGH (ref 70–99)
HCT: 23 % — ABNORMAL LOW (ref 39.0–52.0)
Hemoglobin: 7.8 g/dL — ABNORMAL LOW (ref 13.0–17.0)
Potassium: 3.1 mmol/L — ABNORMAL LOW (ref 3.5–5.1)
Sodium: 151 mmol/L — ABNORMAL HIGH (ref 135–145)
TCO2: 21 mmol/L — AB (ref 22–32)

## 2018-06-17 LAB — PROTIME-INR
INR: 1.18
PROTHROMBIN TIME: 14.9 s (ref 11.4–15.2)

## 2018-06-17 LAB — COMPREHENSIVE METABOLIC PANEL
ALT: 50 U/L — ABNORMAL HIGH (ref 0–44)
AST: 76 U/L — ABNORMAL HIGH (ref 15–41)
Albumin: 3 g/dL — ABNORMAL LOW (ref 3.5–5.0)
Alkaline Phosphatase: 64 U/L (ref 38–126)
Anion gap: 12 (ref 5–15)
BUN: 42 mg/dL — ABNORMAL HIGH (ref 8–23)
CHLORIDE: 115 mmol/L — AB (ref 98–111)
CO2: 23 mmol/L (ref 22–32)
Calcium: 8.8 mg/dL — ABNORMAL LOW (ref 8.9–10.3)
Creatinine, Ser: 1.38 mg/dL — ABNORMAL HIGH (ref 0.61–1.24)
GFR calc Af Amer: 55 mL/min — ABNORMAL LOW (ref >60)
GFR calc non Af Amer: 48 mL/min — ABNORMAL LOW (ref >60)
Glucose, Bld: 164 mg/dL — ABNORMAL HIGH (ref 70–99)
Potassium: 4 mmol/L (ref 3.5–5.1)
Sodium: 150 mmol/L — ABNORMAL HIGH (ref 135–145)
Total Bilirubin: 1.1 mg/dL (ref 0.3–1.2)
Total Protein: 6.7 g/dL (ref 6.5–8.1)

## 2018-06-17 LAB — I-STAT ARTERIAL BLOOD GAS, ED
Acid-base deficit: 3 mmol/L — ABNORMAL HIGH (ref 0.0–2.0)
BICARBONATE: 21.4 mmol/L (ref 20.0–28.0)
O2 Saturation: 100 %
Patient temperature: 98.6
TCO2: 22 mmol/L (ref 22–32)
pCO2 arterial: 34.2 mmHg (ref 32.0–48.0)
pH, Arterial: 7.405 (ref 7.350–7.450)
pO2, Arterial: 404 mmHg — ABNORMAL HIGH (ref 83.0–108.0)

## 2018-06-17 LAB — ETHANOL: Alcohol, Ethyl (B): <10 mg/dL (ref <10)

## 2018-06-17 LAB — MRSA PCR SCREENING: MRSA by PCR: NEGATIVE

## 2018-06-17 LAB — ABO/RH: ABO/RH(D): A POS

## 2018-06-17 LAB — LACTIC ACID, PLASMA: Lactic Acid, Venous: 3.1 mmol/L (ref 0.5–1.9)

## 2018-06-17 LAB — I-STAT CG4 LACTIC ACID, ED: Lactic Acid, Venous: 7.24 mmol/L (ref 0.5–1.9)

## 2018-06-17 LAB — I-STAT TROPONIN, ED: Troponin i, poc: 0.06 ng/mL (ref 0.00–0.08)

## 2018-06-17 LAB — TROPONIN I: Troponin I: 0.08 ng/mL (ref <0.03)

## 2018-06-17 LAB — GLUCOSE, CAPILLARY: Glucose-Capillary: 184 mg/dL — ABNORMAL HIGH (ref 70–99)

## 2018-06-17 MED ORDER — PANTOPRAZOLE SODIUM 40 MG IV SOLR
40.0000 mg | Freq: Every day | INTRAVENOUS | Status: DC
  Administered 2018-06-17 – 2018-06-20 (×4): 40 mg via INTRAVENOUS
  Filled 2018-06-17 (×4): qty 40

## 2018-06-17 MED ORDER — DEXTROSE-NACL 5-0.45 % IV SOLN
INTRAVENOUS | Status: DC
  Administered 2018-06-17 – 2018-06-18 (×2): via INTRAVENOUS

## 2018-06-17 MED ORDER — LACTATED RINGERS IV SOLN: INTRAVENOUS | Status: DC

## 2018-06-17 MED ORDER — FENTANYL CITRATE (PF) 100 MCG/2ML IJ SOLN: 50.0000 ug | INTRAMUSCULAR | Status: DC | PRN

## 2018-06-17 MED ORDER — CHLORHEXIDINE GLUCONATE 0.12% ORAL RINSE (MEDLINE KIT)
15.0000 mL | Freq: Two times a day (BID) | OROMUCOSAL | Status: DC
  Administered 2018-06-17 – 2018-06-21 (×8): 15 mL via OROMUCOSAL

## 2018-06-17 MED ORDER — SODIUM CHLORIDE 0.9 % IV BOLUS
1000.0000 mL | Freq: Once | INTRAVENOUS | Status: AC
  Administered 2018-06-17: 1000 mL via INTRAVENOUS

## 2018-06-17 MED ORDER — SODIUM CHLORIDE 0.9 % IV SOLN
3.0000 g | INTRAVENOUS | Status: AC
  Administered 2018-06-17: 3 g via INTRAVENOUS
  Filled 2018-06-17: qty 3

## 2018-06-17 MED ORDER — POTASSIUM CHLORIDE 20 MEQ/15ML (10%) PO SOLN
40.0000 meq | Freq: Once | ORAL | Status: AC
  Administered 2018-06-17: 40 meq
  Filled 2018-06-17: qty 30

## 2018-06-17 MED ORDER — ORAL CARE MOUTH RINSE
15.0000 mL | OROMUCOSAL | Status: DC
  Administered 2018-06-17 – 2018-06-21 (×37): 15 mL via OROMUCOSAL

## 2018-06-17 MED ORDER — MIDAZOLAM HCL 2 MG/2ML IJ SOLN
1.0000 mg | INTRAMUSCULAR | Status: DC | PRN
  Administered 2018-06-18: 1 mg via INTRAVENOUS

## 2018-06-17 MED ORDER — CALCIUM GLUCONATE-NACL 1-0.675 GM/50ML-% IV SOLN
1.0000 g | Freq: Once | INTRAVENOUS | Status: AC
  Administered 2018-06-17: 1000 mg via INTRAVENOUS
  Filled 2018-06-17: qty 50

## 2018-06-17 MED ORDER — HEPARIN SODIUM (PORCINE) 5000 UNIT/ML IJ SOLN
5000.0000 [IU] | Freq: Three times a day (TID) | INTRAMUSCULAR | Status: DC
  Administered 2018-06-17 – 2018-06-21 (×11): 5000 [IU] via SUBCUTANEOUS
  Filled 2018-06-17 (×12): qty 1

## 2018-06-17 MED ORDER — MIDAZOLAM HCL 2 MG/2ML IJ SOLN
1.0000 mg | INTRAMUSCULAR | Status: DC | PRN
  Filled 2018-06-17: qty 2

## 2018-06-17 MED ORDER — ASPIRIN 81 MG PO CHEW
81.0000 mg | CHEWABLE_TABLET | Freq: Every day | ORAL | Status: DC
  Administered 2018-06-17 – 2018-06-21 (×5): 81 mg
  Filled 2018-06-17 (×5): qty 1

## 2018-06-17 MED ORDER — SODIUM CHLORIDE 0.9 % IV SOLN
3.0000 g | Freq: Two times a day (BID) | INTRAVENOUS | Status: DC
  Administered 2018-06-18 – 2018-06-21 (×7): 3 g via INTRAVENOUS
  Filled 2018-06-17 (×8): qty 3

## 2018-06-17 MED ORDER — ATORVASTATIN CALCIUM 40 MG PO TABS
40.0000 mg | ORAL_TABLET | Freq: Every day | ORAL | Status: DC
  Administered 2018-06-18 – 2018-06-20 (×3): 40 mg
  Filled 2018-06-17 (×3): qty 1

## 2018-06-17 NOTE — Progress Notes (Signed)
[facility] Progress Note Patient Name: Andre Silva DOB: September 01, 1934 MRN: [id number]   Date of Service  05/31/2018  HPI/Events of Note  Lactic Acid = 7.24 --> 3.1. In setting of post cardiac arrest. Lactic acid appears to be clearing.   eICU Interventions  Continue to trend lactic acid level.      Intervention Category Major Interventions: Acid-Base disturbance - evaluation and management  Sommer,Steven Eugene 06/01/2018, 8:45 PM

## 2018-06-17 NOTE — Progress Notes (Signed)
Patient came in intubated with 8.0 ETT taped at 23 cm @ lip, good BBS ausculted. SATS 100%, placed on above vent settings, MD aware.

## 2018-06-17 NOTE — Progress Notes (Signed)
Pharmacy Antibiotic Note  Andre Silva is a 83 y.o. male admitted on 06/19/2018 with possible aspiration PNA.  Pharmacy has been consulted for Unasyn dosing.  Plan: Unasyn 3g q 12 hrs F/u cultures, renal function, and clinical course  Height: 5\' 8"  (172.7 cm) IBW/kg (Calculated) : 68.4  No data recorded.  Recent Labs  Lab 05/22/2018 1555 06/12/2018 1556  CREATININE 1.10  --   LATICACIDVEN  --  7.24*    CrCl cannot be calculated (Unknown ideal weight.).    No Known Allergies  Antimicrobials this admission: Unasyn 12/27 >   Dose adjustments this admission:  Microbiology results: 12/27 BCx x 2:  12/27 UCx:  12/27 Sputum:   Thank you for allowing pharmacy to be a part of this patient's care.  Marguerite Olea, [facility] Clinical Pharmacist Phone [phone]  06/02/2018 6:12 PM

## 2018-06-17 NOTE — ED Triage Notes (Signed)
Pt here post arrest , asystole first rhythm cpr started 1428, cap 30 went into pea, received 5 epi , pulled waffle out of throat and intubated with 8.0 tube by ems , 1 amp of bicarb , cbg 494 , arrived to ED st

## 2018-06-17 NOTE — H&P (Signed)
NAME:  Andre Silva, MRN:  [id number], DOB:  [birth year], LOS: 0 ADMISSION DATE:  05/31/2018, CONSULTATION DATE:  05/28/2018 REFERRING MD:  Ralene Bathe - ED, CHIEF COMPLAINT:  Post-arrest  Brief History   83yo male pt presenting to ED s/p arrest. Patient was found down for unknown time (0-30 minutes) and received CPR x 30 minutes, bicarb, and 5x epi before ROSC achieved by EMS. Intubated in field by EMS.   History of present illness   83 yo male with PMH significant for dementia, TIA, Lung cancer (s/p lobectomy) presenting to [name] ED 05/31/2018 post-arrest. The patient was last seen normal at 1400 and was found at 1428 to be unresponsive. Patient was unresponsive for unknown period of time. EMS was dispatched. CPR was performed for 30 minutes, initial rhythm asystole, then wide complex PEA. Patient received 5x epi as well as bicarb. Of note, food matter was found by EMS in patient's trachea. Patient was intubated in field after retrieval of food matter.  In ED remained unresponsive despite no administered sedation. Patient is HDS. Patient is hypothermic 31*C despite no cooling measures.  Past Medical History  TIA Dementia Lung cancer HTN HLD syncope Closed hip fracture, left  Malnutrition Absence of smell Chronic pain (low back, knee)  Significant Hospital Events   12/27> admitted post-arrest  Consults:  PCCM  Procedures:  12/27> intubation (in field by EMS)  Significant Diagnostic Tests:  12/27 ECG> sinus rhythm with non-specific T wave abnormality  12/27 CXR> ETT visualized at thoracic inlet 12/27 CT head>>>   Micro Data:  12/27 Urine Cx >>> 12/27 Blood Cx >>>  Antimicrobials:  none  Interim history/subjective:  12/27 patient to ED post-arrest.  Objective   Blood pressure 100/76, pulse 94, resp. rate 18, height 5\' 8"  (1.727 m), SpO2 100 %.    Vent Mode: PRVC FiO2 (%):  [50 %] 50 % Set Rate:  [16 bmp] 16 bmp Vt Set:  [550 mL] 550 mL PEEP:  [5 cmH20] 5  cmH20 Plateau Pressure:  [15 cmH20] 15 cmH20  No intake or output data in the 24 hours ending 05/29/2018 1636 There were no vitals filed for this visit.  Examination: General: cachetic, older adult male. Intubated.  HENT: normocephalic, atraumatic. ETT secured.  Lungs: CTA Cardiovascular: RRR  Abdomen: thin, soft, normoactive x 4 quadrants Extremities: symmetrical muscle wasting BUE BLE Neuro: Does not open eyes to noxious stimuli. Pupils sluggish bilaterally.  Skin: cool, dry, intact.   Resolved Hospital Problem list     Assessment & Plan:  Cardiac Arrest - unclear etiology at this point.  Some concern for choke as initial event causing respiratory leading to cardiac arrest (piece of waffle removed from oral cavity by EMS).  Unknown downtime, potentially up to 30 minutes with an additional 30 minutes of ACLS prior to ROSC. - Admit to CCU. - Defer cooling given prolonged downtime and the fact that pt is already at 31 degrees. - Trend troponin, lactate. - Assess UDS, echo. - Children (8 of them) will be having a teleconference to discuss goals of care.  Hx HTN, HLD. - Continue preadmission ASA, atorvastatin. - Hold preadmission lopressor.  Hypernatremia / Hyperchloremia. - Start D5 1/2 NS @ 50cc/hr. - Follow BMP.  Hypokalemia. - 59mEq K per tube. - Follow BMP.  Hypocalcemia. - 1g Ca gluconate.  Anemia - chronic.  No signs of bleeding. - Transfuse for Hgb < 7. - Monitor for bleeding.  Acute encephalopathy with concern for anoxic injury. - F/u on  head CT. - Assess ethanol, UDS. - Might need neuro input / EEG / further imaging if no improvement in mental status.  Best practice:  Diet: Start Enteral Nutrition Pain/Anxiety/Delirium protocol (if indicated): Fentanyl PRN / Midazolam PRN.  RASS goal 0 to -1. VAP protocol (if indicated): In place. DVT prophylaxis: SCD's / heparin. GI prophylaxis: Pantoprazole. Glucose control: N/A. Mobility: Bedrest. Code Status:  Full. Family Communication: Pt has 8 children, 1 being a daughter who is a Software engineer in [place] [employer] [phone].  Leda Gauze will have a teleconference with remainder of siblings regarding goals of care discussions.  Some of siblings who live near by will be at hospital tonight at some point, Leda Gauze will likely not be able to arrive until Monday.  Family wishes are to continue full code for now until they have an opportunity to have a conference call. Disposition: ICU.  Labs   CBC: Recent Labs  Lab 06/15/2018 1555  HGB 7.8*  HCT 23.0*    Basic Metabolic Panel: Recent Labs  Lab 06/08/2018 1555  NA 151*  K 3.1*  CL 117*  GLUCOSE 110*  BUN 36*  CREATININE 1.10   GFR: CrCl cannot be calculated (Unknown ideal weight.). Recent Labs  Lab 06/10/2018 1556  LATICACIDVEN 7.24*    Liver Function Tests: No results for input(s): AST, ALT, ALKPHOS, BILITOT, PROT, ALBUMIN in the last 168 hours. No results for input(s): LIPASE, AMYLASE in the last 168 hours. No results for input(s): AMMONIA in the last 168 hours.  ABG    Component Value Date/Time   PHART 7.405 06/03/2018 1544   PCO2ART 34.2 06/16/2018 1544   PO2ART 404.0 (H) 06/11/2018 1544   HCO3 21.4 06/16/2018 1544   TCO2 21 (L) 06/06/2018 1555   ACIDBASEDEF 3.0 (H) 05/23/2018 1544   O2SAT 100.0 06/10/2018 1544     Coagulation Profile: No results for input(s): INR, PROTIME in the last 168 hours.  Cardiac Enzymes: No results for input(s): CKTOTAL, CKMB, CKMBINDEX, TROPONINI in the last 168 hours.  HbA1C: Hgb A1c MFr Bld  Date/Time Value Ref Range Status  09/17/2013 06:24 PM 5.6 <5.7 % Final    Comment:    (NOTE)                                                                       According to the ADA Clinical Practice Recommendations for 2011, when HbA1c is used as a screening test:  >=6.5%   Diagnostic of Diabetes Mellitus           (if abnormal result is confirmed) 5.7-6.4%   Increased risk of developing  Diabetes Mellitus References:Diagnosis and Classification of Diabetes Mellitus,Diabetes CWCB,7628,31(DVVOH 1):S62-S69 and Standards of Medical Care in         Diabetes - 2011,Diabetes YWVP,7106,26 (Suppl 1):S11-S61.    CBG: No results for input(s): GLUCAP in the last 168 hours.  Review of Systems:   Unable to obtain due to patient factors (intubated, poor neuro status post-arrest) Past Medical History  He,  has a past medical history of Absent sense of smell, Corns and callosities, Hypertension, Intermittent cerebral ischemia, Knee joint pain, Low back pain, Lung cancer ([place]), Problems with swallowing and mastication, Skin cyst, and Unspecified urinary incontinence.   Surgical History  Past Surgical History:  Procedure Laterality Date  . LOBECTOMY Right 04/15/2015   RUL lobectomy  . TOTAL HIP ARTHROPLASTY Left 11/06/2017   Procedure: TOTAL HIP ARTHROPLASTY ANTERIOR APPROACH;  Surgeon: Mcarthur Rossetti, MD;  Location: [facility];  Service: Orthopedics;  Laterality: Left;     Social History   reports that he has been smoking cigarettes. He has a 17.00 pack-year smoking history. He has never used smokeless tobacco. He reports current alcohol use of about 6.0 standard drinks of alcohol per week. He reports that he does not use drugs.   Family History   His family history includes Breast cancer in his mother.   Allergies No Known Allergies   Home Medications  Prior to Admission medications   Medication Sig Start Date End Date Taking? Authorizing Provider  aspirin 81 MG chewable tablet Chew 1 tablet (81 mg total) by mouth 2 (two) times daily after a meal. 11/09/17   Mcarthur Rossetti, MD  atorvastatin (LIPITOR) 40 MG tablet Take 40 mg by mouth at bedtime.    [provider]  cetirizine (ZYRTEC) 10 MG tablet Take 10 mg by mouth daily.    [provider]  HYDROcodone-acetaminophen (NORCO) 5-325 MG tablet Take 1-2 tablets by mouth every 6 (six) hours as needed  for moderate pain. 11/09/17   Mcarthur Rossetti, MD  metoprolol tartrate (LOPRESSOR) 50 MG tablet Take 25 mg by mouth 2 (two) times daily.    [provider]  Omega-3 Fatty Acids (FISH OIL PO) Take 1 capsule by mouth daily.     [provider]  pantoprazole (PROTONIX) 40 MG tablet Take 40 mg by mouth 2 (two) times daily.    [provider]  ranitidine (ZANTAC) 300 MG tablet Take 300 mg by mouth at bedtime.    [provider]  tamsulosin (FLOMAX) 0.4 MG CAPS capsule Take 0.4 mg by mouth daily.    [provider]  zinc gluconate 50 MG tablet Take 50 mg by mouth daily.    [provider]     Critical care time: 35 min    Eliseo Gum MSN, AGACNP-BC

## 2018-06-17 NOTE — Progress Notes (Signed)
Received patient from ED via stretcher.  Patient placed on our ICU bed and placed on monitor and zoll.  Patient's temperature currently 32.4 degrees.  Bair hugger applied.  Patient with briefs on and a foley upon arrival.  Briefs removed and peri care and foley care done.  No skin breakdown noted but old healed pressure ulcers noted to right hip and sacrum/coccyx area.  Allevyn applied to sacrum and to heels prophylactically.  Old healed incision noted to left hip with staples intact.  Patient had a surgery in May on Left hip.  Son into visit.  Unable to attain health history from him due to he has not seen his father in quite some time per son.  Will try and attain health history from daughter or "lady friend" that he lives with when they arrive.  Currently patient's Glascow is 3.  No response to noxious stimuli noted.  Does have chewing motion with mouth.

## 2018-06-17 NOTE — ED Provider Notes (Signed)
[facility] EMERGENCY DEPARTMENT Provider Note   CSN: [id number] Arrival date & time: 06/12/2018  1521     History   Chief Complaint Chief Complaint  Patient presents with  . Cardiac Arrest    HPI Andre Silva is a 83 y.o. male.  The history is provided by the EMS personnel and medical records. No language interpreter was used.  Cardiac Arrest   Andre Silva is a 83 y.o. male who presents to the Emergency Department complaining of cardiac arrest. Level V caveat due to unresponsiveness. History is provided by EMS. EMS reports that the patient was last known to be at his baseline about 2 PM today. At that time he was eating a waffle on the couch. EMS was called out at 228 for cardiac arrest and unresponsiveness. On fire arrival he was asystole and CPR was initiated. On EMS arrival he was intubated and at the time of intubation a piece of waffle was taken out of his trachea. ACS protocol was continued with epinephrine and chest compressions. He developed a wide complex rhythm at about 80 bpm. He was given siding bicarbonate. He then had Rosc. He had approximately 30 minutes of CPR. EMS note that when Rosc was achieved patient did have some spontaneous movement of his extremities. Past Medical History:  Diagnosis Date  . Absent sense of smell   . Corns and callosities   . Hypertension   . Intermittent cerebral ischemia   . Knee joint pain   . Low back pain   . Lung cancer ([place])   . Problems with swallowing and mastication   . Skin cyst   . Unspecified urinary incontinence     Patient Active Problem List   Diagnosis Date Noted  . Cardiac arrest ([place]) 06/07/2018  . Protein-calorie malnutrition, severe 11/07/2017  . Closed left hip fracture, initial encounter ([place]) 11/05/2017  . HLD (hyperlipidemia) 11/05/2017  . Presumed primary cancer of left lower lobe of lung ([place]) 10/15/2016  . Primary cancer of right upper lobe of lung ([place]) 04/05/2015  . HTN  (hypertension) 09/18/2013  . Syncope and collapse 09/18/2013  . TIA (transient ischemic attack) 09/17/2013    Past Surgical History:  Procedure Laterality Date  . LOBECTOMY Right 04/15/2015   RUL lobectomy  . TOTAL HIP ARTHROPLASTY Left 11/06/2017   Procedure: TOTAL HIP ARTHROPLASTY ANTERIOR APPROACH;  Surgeon: Mcarthur Rossetti, MD;  Location: [facility];  Service: Orthopedics;  Laterality: Left;        Home Medications    Prior to Admission medications   Medication Sig Start Date End Date Taking? Authorizing Provider  aspirin 81 MG chewable tablet Chew 1 tablet (81 mg total) by mouth 2 (two) times daily after a meal. 11/09/17   Mcarthur Rossetti, MD  atorvastatin (LIPITOR) 40 MG tablet Take 40 mg by mouth at bedtime.    [provider]  cetirizine (ZYRTEC) 10 MG tablet Take 10 mg by mouth daily.    [provider]  HYDROcodone-acetaminophen (NORCO) 5-325 MG tablet Take 1-2 tablets by mouth every 6 (six) hours as needed for moderate pain. 11/09/17   Mcarthur Rossetti, MD  metoprolol tartrate (LOPRESSOR) 50 MG tablet Take 25 mg by mouth 2 (two) times daily.    [provider]  Omega-3 Fatty Acids (FISH OIL PO) Take 1 capsule by mouth daily.     [provider]  pantoprazole (PROTONIX) 40 MG tablet Take 40 mg by mouth 2 (two) times daily.  [provider]  ranitidine (ZANTAC) 300 MG tablet Take 300 mg by mouth at bedtime.    [provider]  tamsulosin (FLOMAX) 0.4 MG CAPS capsule Take 0.4 mg by mouth daily.    [provider]  zinc gluconate 50 MG tablet Take 50 mg by mouth daily.    [provider]    Family History Family History  Problem Relation Age of Onset  . Breast cancer Mother     Social History Social History   Tobacco Use  . Smoking status: Current Every Day Smoker    Packs/day: 0.25    Years: 68.00    Pack years: 17.00    Types: Cigarettes  . Smokeless tobacco: Never Used    Substance Use Topics  . Alcohol use: Yes    Alcohol/week: 6.0 standard drinks    Types: 6 Cans of beer per week    Comment: Six pack of beer per week  . Drug use: No     Allergies   Patient has no known allergies.   Review of Systems Review of Systems  Unable to perform ROS: Patient unresponsive     Physical Exam Updated Vital Signs BP 97/74 (BP Location: Right Arm)   Pulse 100   Temp 99.7 F (37.6 C)   Resp 12   Ht '5\' 8"'  (1.727 m)   Wt 37.6 kg   SpO2 100%   BMI 12.60 kg/m   Physical Exam Vitals signs and nursing note reviewed.  Constitutional:      Appearance: He is well-developed.     Comments: Unresponsive.  Cachectic.  HENT:     Head: Normocephalic and atraumatic.  Cardiovascular:     Rate and Rhythm: Normal rate and regular rhythm.     Heart sounds: No murmur.  Pulmonary:     Comments: Endotracheal tube in the OP, BVM respirations with good air movement bilaterally Abdominal:     Palpations: Abdomen is soft.     Tenderness: There is no abdominal tenderness. There is no guarding or rebound.  Musculoskeletal:     Comments: Staples in place to the left hip without any surrounding erythema or edema. 2+ femoral pulses bilaterally. IO in left tibia  Skin:    General: Skin is warm and dry.  Neurological:     Comments: GCS 1-1-1, there is some resistance on eye-opening.  Psychiatric:     Comments: Unable to assess      ED Treatments / Results  Labs (all labs ordered are listed, but only abnormal results are displayed) Labs Reviewed  CBC WITH DIFFERENTIAL/PLATELET - Abnormal; Notable for the following components:      Result Value   RBC 3.78 (*)    Hemoglobin 10.3 (*)    HCT 34.3 (*)    RDW 18.8 (*)    Lymphs Abs 0.5 (*)    Monocytes Absolute 0.0 (*)    All other components within normal limits  COMPREHENSIVE METABOLIC PANEL - Abnormal; Notable for the following components:   Sodium 150 (*)    Chloride 115 (*)    Glucose, Bld 164 (*)    BUN 42  (*)    Creatinine, Ser 1.38 (*)    Calcium 8.8 (*)    Albumin 3.0 (*)    AST 76 (*)    ALT 50 (*)    GFR calc non Af Amer 48 (*)    GFR calc Af Amer 55 (*)    All other components within normal limits  TROPONIN I -  Abnormal; Notable for the following components:   Troponin I 0.08 (*)    All other components within normal limits  LACTIC ACID, PLASMA - Abnormal; Notable for the following components:   Lactic Acid, Venous 3.1 (*)    All other components within normal limits  GLUCOSE, CAPILLARY - Abnormal; Notable for the following components:   Glucose-Capillary 184 (*)    All other components within normal limits  GLUCOSE, CAPILLARY - Abnormal; Notable for the following components:   Glucose-Capillary 148 (*)    All other components within normal limits  I-STAT CHEM 8, ED - Abnormal; Notable for the following components:   Sodium 151 (*)    Potassium 3.1 (*)    Chloride 117 (*)    BUN 36 (*)    Glucose, Bld 110 (*)    Calcium, Ion 0.95 (*)    TCO2 21 (*)    Hemoglobin 7.8 (*)    HCT 23.0 (*)    All other components within normal limits  I-STAT CG4 LACTIC ACID, ED - Abnormal; Notable for the following components:   Lactic Acid, Venous 7.24 (*)    All other components within normal limits  I-STAT ARTERIAL BLOOD GAS, ED - Abnormal; Notable for the following components:   pO2, Arterial 404.0 (*)    Acid-base deficit 3.0 (*)    All other components within normal limits  MRSA PCR SCREENING  URINE CULTURE  CULTURE, BLOOD (ROUTINE X 2)  CULTURE, BLOOD (ROUTINE X 2)  CULTURE, RESPIRATORY  PROTIME-INR  ETHANOL  URINALYSIS, ROUTINE W REFLEX MICROSCOPIC  BLOOD GAS, ARTERIAL  RAPID URINE DRUG SCREEN, HOSP PERFORMED  TROPONIN I  TROPONIN I  LACTIC ACID, PLASMA  CBC  BASIC METABOLIC PANEL  BLOOD GAS, ARTERIAL  MAGNESIUM  PHOSPHORUS  I-STAT TROPONIN, ED  I-STAT CG4 LACTIC ACID, ED  TYPE AND SCREEN  ABO/RH    EKG EKG Interpretation  Date/Time:  Friday June 17 2018  15:25:30 EST Ventricular Rate:  96 PR Interval:    QRS Duration: 128 QT Interval:  462 QTC Calculation: 584 R Axis:   -74 Text Interpretation:  Sinus rhythm Nonspecific IVCD with LAD Abnrm T, consider ischemia, anterolateral lds Confirmed by Quintella Reichert [phone]) on 05/27/2018 3:51:04 PM   Radiology Ct Head Wo Contrast  Result Date: 05/22/2018 CLINICAL DATA:  Pt here post arrest , asystole first rhythm CPR started 1428, cap 30 went into pea, received 5 epi , pulled waffle out of throat and intubated. EXAM: CT HEAD WITHOUT CONTRAST TECHNIQUE: Contiguous axial images were obtained from the base of the skull through the vertex without intravenous contrast. COMPARISON:  04/25/2018 FINDINGS: Brain: There is central and cortical atrophy. Periventricular white matter changes are consistent with small vessel disease. There is no intra or extra-axial fluid collection or mass lesion. The basilar cisterns and ventricles have a normal appearance. There is no CT evidence for acute infarction or hemorrhage. Vascular: There is atherosclerotic calcification of the internal carotid arteries. No hyperdense vessels. Skull: Normal. Negative for fracture or focal lesion. Sinuses/Orbits: There is minimal mucosal thickening of the paranasal sinuses. No air-fluid levels. Mastoid air cells are normally aerated. Other: None IMPRESSION: 1. Atrophy and small vessel disease. 2.  No evidence for acute intracranial abnormality. 3. Minimal sinus disease. Electronically Signed   By: Nolon Nations M.D.   On: 06/04/2018 17:40   Dg Chest Port 1 View  Result Date: 06/19/2018 CLINICAL DATA:  Patient presented with cardiac arrest. Personal history of RIGHT UPPER lobectomy for lung  cancer in 2016. EXAM: PORTABLE CHEST 1 VIEW COMPARISON:  11/08/2017 and earlier, including CT chest 11/05/2017. FINDINGS: External pacing pads. Nasogastric tube courses below the diaphragm into the stomach. Nasogastric tube tip is at the thoracic inlet.  Cardiac silhouette normal in size for AP portable technique, unchanged. Thoracic aorta mildly atherosclerotic, unchanged. Hilar and mediastinal contours otherwise unremarkable. Severe emphysematous changes throughout both lungs. Postsurgical pleuroparenchymal scar at the RIGHT base. Lungs otherwise clear. The spiculated nodule in the MEDIAL LEFT LOWER LOBE identified on the prior CT is inconspicuous on the current chest x-ray. IMPRESSION: 1. Endotracheal tube tip at the thoracic inlet. It could be advanced 5 cm or so if needed. 2. Remaining support apparatus satisfactory. 3. No acute cardiopulmonary disease. Previously identified MEDIAL LEFT LOWER LOBE lung nodule inconspicuous on the portable chest x-ray, likely obscured by the external pacing pads. 4.  Emphysema. ([id number]) Electronically Signed   By: Evangeline Dakin M.D.   On: 05/25/2018 16:21    Procedures Procedures (including critical care time) CRITICAL CARE Performed by: Quintella Reichert   Total critical care time: 40 minutes  Critical care time was exclusive of separately billable procedures and treating other patients.  Critical care was necessary to treat or prevent imminent or life-threatening deterioration.  Critical care was time spent personally by me on the following activities: development of treatment plan with patient and/or surrogate as well as nursing, discussions with consultants, evaluation of patient's response to treatment, examination of patient, obtaining history from patient or surrogate, ordering and performing treatments and interventions, ordering and review of laboratory studies, ordering and review of radiographic studies, pulse oximetry and re-evaluation of patient's condition.  Medications Ordered in ED Medications  fentaNYL (SUBLIMAZE) injection 50 mcg (has no administration in time range)  fentaNYL (SUBLIMAZE) injection 50 mcg (has no administration in time range)  midazolam (VERSED) injection 1 mg (has no  administration in time range)  midazolam (VERSED) injection 1 mg (has no administration in time range)  heparin injection 5,000 Units (5,000 Units Subcutaneous Given 06/13/2018 2218)  aspirin chewable tablet 81 mg (81 mg Per Tube Given 06/16/2018 1705)  atorvastatin (LIPITOR) tablet 40 mg (has no administration in time range)  dextrose 5 %-0.45 % sodium chloride infusion ( Intravenous Rate/Dose Verify 06/18/18 0000)  pantoprazole (PROTONIX) injection 40 mg (40 mg Intravenous Given 06/06/2018 2218)  chlorhexidine gluconate (MEDLINE KIT) (PERIDEX) 0.12 % solution 15 mL (15 mLs Mouth Rinse Given 06/03/2018 2040)  MEDLINE mouth rinse (15 mLs Mouth Rinse Given 06/18/18 0003)  Ampicillin-Sulbactam (UNASYN) 3 g in sodium chloride 0.9 % 100 mL IVPB (has no administration in time range)  sodium chloride 0.9 % bolus 1,000 mL (0 mLs Intravenous Stopped 05/24/2018 1656)  potassium chloride 20 MEQ/15ML (10%) solution 40 mEq (40 mEq Per Tube Given 05/22/2018 1705)  calcium gluconate 1 g/ 50 mL sodium chloride IVPB (0 mg Intravenous Stopped 05/26/2018 1842)  Ampicillin-Sulbactam (UNASYN) 3 g in sodium chloride 0.9 % 100 mL IVPB ( Intravenous Stopped 05/26/2018 1904)     Initial Impression / Assessment and Plan / ED Course  I have reviewed the triage vital signs and the nursing notes.  Pertinent labs & imaging results that were available during my care of the patient were reviewed by me and considered in my medical decision making (see chart for details).     Patient here for evaluation following cardiac arrest with return of spontaneous circulation. By history this appears to be a respiratory arrest due to obstruction. Caregiver not available for additional  history of her recent illness. Discussed current condition of patient with his daughter in [place], [phone]. He has eight children and no formal power of attorney. Family is on the way to the hospital. Critical care consulted for admission for  further evaluation and treatment.  Final Clinical Impressions(s) / ED Diagnoses   Final diagnoses:  None    ED Discharge Orders    None       Quintella Reichert, MD 06/18/18 [phone]

## 2018-06-17 NOTE — Progress Notes (Signed)
CDS called. Referral number [id number]. Call back with cardiac TOD or plans for brain death testing. Potential tissue donor. Information relayed to bedside staff.

## 2018-06-18 ENCOUNTER — Inpatient Hospital Stay (HOSPITAL_COMMUNITY): Payer: No Typology Code available for payment source

## 2018-06-18 DIAGNOSIS — I469 Cardiac arrest, cause unspecified: Secondary | ICD-10-CM

## 2018-06-18 DIAGNOSIS — J9600 Acute respiratory failure, unspecified whether with hypoxia or hypercapnia: Secondary | ICD-10-CM

## 2018-06-18 DIAGNOSIS — N179 Acute kidney failure, unspecified: Secondary | ICD-10-CM

## 2018-06-18 LAB — URINALYSIS, ROUTINE W REFLEX MICROSCOPIC
Bilirubin Urine: NEGATIVE
Glucose, UA: NEGATIVE mg/dL
Ketones, ur: 5 mg/dL — AB
Leukocytes, UA: NEGATIVE
Nitrite: NEGATIVE
Protein, ur: 100 mg/dL — AB
Specific Gravity, Urine: 1.02 (ref 1.005–1.030)
pH: 5 (ref 5.0–8.0)

## 2018-06-18 LAB — BASIC METABOLIC PANEL
Anion gap: 14 (ref 5–15)
BUN: 41 mg/dL — ABNORMAL HIGH (ref 8–23)
CALCIUM: 8.3 mg/dL — AB (ref 8.9–10.3)
CO2: 23 mmol/L (ref 22–32)
Chloride: 114 mmol/L — ABNORMAL HIGH (ref 98–111)
Creatinine, Ser: 1.65 mg/dL — ABNORMAL HIGH (ref 0.61–1.24)
GFR calc Af Amer: 44 mL/min — ABNORMAL LOW (ref >60)
GFR calc non Af Amer: 38 mL/min — ABNORMAL LOW (ref >60)
Glucose, Bld: 130 mg/dL — ABNORMAL HIGH (ref 70–99)
Potassium: 3.8 mmol/L (ref 3.5–5.1)
SODIUM: 151 mmol/L — AB (ref 135–145)

## 2018-06-18 LAB — RAPID URINE DRUG SCREEN, HOSP PERFORMED
Amphetamines: NOT DETECTED
Barbiturates: NOT DETECTED
Benzodiazepines: NOT DETECTED
COCAINE: NOT DETECTED
Opiates: NOT DETECTED
Tetrahydrocannabinol: NOT DETECTED

## 2018-06-18 LAB — BLOOD GAS, ARTERIAL
Acid-Base Excess: 1.9 mmol/L (ref 0.0–2.0)
Bicarbonate: 24.3 mmol/L (ref 20.0–28.0)
Drawn by: 51155
FIO2: 40
MECHVT: 500 mL
O2 Saturation: 99.2 %
PEEP: 5 cmH2O
Patient temperature: 98.6
RATE: 15 resp/min
pCO2 arterial: 27.1 mmHg — ABNORMAL LOW (ref 32.0–48.0)
pH, Arterial: 7.56 — ABNORMAL HIGH (ref 7.350–7.450)
pO2, Arterial: 183 mmHg — ABNORMAL HIGH (ref 83.0–108.0)

## 2018-06-18 LAB — CBC
HCT: 31.5 % — ABNORMAL LOW (ref 39.0–52.0)
Hemoglobin: 9.6 g/dL — ABNORMAL LOW (ref 13.0–17.0)
MCH: 27.5 pg (ref 26.0–34.0)
MCHC: 30.5 g/dL (ref 30.0–36.0)
MCV: 90.3 fL (ref 80.0–100.0)
Platelets: 255 10*3/uL (ref 150–400)
RBC: 3.49 MIL/uL — ABNORMAL LOW (ref 4.22–5.81)
RDW: 18.9 % — AB (ref 11.5–15.5)
WBC: 7.1 10*3/uL (ref 4.0–10.5)
nRBC: 0 % (ref 0.0–0.2)

## 2018-06-18 LAB — GLUCOSE, CAPILLARY
Glucose-Capillary: 107 mg/dL — ABNORMAL HIGH (ref 70–99)
Glucose-Capillary: 148 mg/dL — ABNORMAL HIGH (ref 70–99)
Glucose-Capillary: 33 mg/dL — CL (ref 70–99)
Glucose-Capillary: 74 mg/dL (ref 70–99)
Glucose-Capillary: 81 mg/dL (ref 70–99)
Glucose-Capillary: 95 mg/dL (ref 70–99)

## 2018-06-18 LAB — PHOSPHORUS: Phosphorus: 3 mg/dL (ref 2.5–4.6)

## 2018-06-18 LAB — URINE CULTURE: Culture: NO GROWTH

## 2018-06-18 LAB — MAGNESIUM: Magnesium: 2.2 mg/dL (ref 1.7–2.4)

## 2018-06-18 LAB — ECHOCARDIOGRAM COMPLETE
HEIGHTINCHES: 68 in
WEIGHTICAEL: 1386.25 [oz_av]

## 2018-06-18 LAB — TROPONIN I
Troponin I: 0.25 ng/mL (ref <0.03)
Troponin I: 0.33 ng/mL (ref <0.03)

## 2018-06-18 LAB — LACTIC ACID, PLASMA: Lactic Acid, Venous: 1.9 mmol/L (ref 0.5–1.9)

## 2018-06-18 LAB — TRIGLYCERIDES: Triglycerides: 82 mg/dL (ref <150)

## 2018-06-18 MED ORDER — LACTATED RINGERS IV BOLUS
1000.0000 mL | Freq: Once | INTRAVENOUS | Status: AC
  Administered 2018-06-18: 1000 mL via INTRAVENOUS

## 2018-06-18 MED ORDER — LEVETIRACETAM IN NACL 1000 MG/100ML IV SOLN
1000.0000 mg | Freq: Once | INTRAVENOUS | Status: AC
  Administered 2018-06-18: 1000 mg via INTRAVENOUS
  Filled 2018-06-18: qty 100

## 2018-06-18 MED ORDER — CHLORHEXIDINE GLUCONATE 0.12 % MT SOLN
OROMUCOSAL | Status: AC
  Administered 2018-06-18: 15 mL via OROMUCOSAL
  Filled 2018-06-18: qty 15

## 2018-06-18 MED ORDER — LEVETIRACETAM IN NACL 500 MG/100ML IV SOLN
500.0000 mg | Freq: Two times a day (BID) | INTRAVENOUS | Status: DC
  Administered 2018-06-18 – 2018-06-21 (×6): 500 mg via INTRAVENOUS
  Filled 2018-06-18 (×6): qty 100

## 2018-06-18 MED ORDER — JEVITY 1.2 CAL PO LIQD
1000.0000 mL | ORAL | Status: DC
  Administered 2018-06-18 – 2018-06-20 (×3): 1000 mL
  Filled 2018-06-18 (×4): qty 1000

## 2018-06-18 MED ORDER — PROPOFOL 1000 MG/100ML IV EMUL
100.0000 ug/kg/min | INTRAVENOUS | Status: DC
  Administered 2018-06-18: 5 ug/kg/min via INTRAVENOUS
  Administered 2018-06-19: 20 ug/kg/min via INTRAVENOUS
  Administered 2018-06-19: 80 ug/kg/min via INTRAVENOUS
  Administered 2018-06-19 – 2018-06-21 (×9): 100 ug/kg/min via INTRAVENOUS
  Filled 2018-06-18 (×12): qty 100

## 2018-06-18 MED ORDER — MIDAZOLAM HCL 2 MG/2ML IJ SOLN: 1.0000 mg | INTRAMUSCULAR | Status: DC | PRN

## 2018-06-18 NOTE — Progress Notes (Signed)
Dr. Chase Caller at bedside for rounds. MD updated on condition. MD aware of pt low urine output, jerking motions of arms and jaw, staples present from previous hip surgery. MD to review pt chart, place orders as needed.

## 2018-06-18 NOTE — Plan of Care (Signed)

## 2018-06-18 NOTE — Progress Notes (Signed)
Initial Nutrition Assessment  DOCUMENTATION CODES:   Severe malnutrition in context of chronic illness, Underweight  INTERVENTION:    Initiate Jevity 1.2 at 15 ml/hr and increase by 10 ml every 12 hours to goal rate of 55 ml/hr   Provides 1584 kcals, 73 gm protein, 1065 ml free water daily  Monitor magnesium, potassium, and phosphorus daily for at least 3 days, MD to replete as needed, as pt is at HIGH risk for refeeding syndrome  NUTRITION DIAGNOSIS:   Severe Malnutrition related to chronic illness(hx of lung cancer, severe emphysema) as evidenced by severe fat depletion, severe muscle depletion  GOAL:   Patient will meet greater than or equal to 90% of their needs  MONITOR:   Vent status, TF tolerance, Labs, Skin, Weight trends, I & O's  REASON FOR ASSESSMENT:   Consult Enteral/tube feeding initiation and management  ASSESSMENT:   83 yo Male with PMH significant for dementia, TIA, Lung cancer (s/p lobectomy), severe emphysema on CXR presenting to [name] ED 05/30/2018 post-arrest.  Patient is currently intubated on ventilator support MV: 8.9 L/min Temp (24hrs), Avg:96.2 F (35.7 C), Min:90.3 F (32.4 C), Max:101.1 F (38.4 C)  Propofol: 1.18 ml/hr >> 31 fat kcals  OGT in place  RD unable to obtain nutrition hx from pt at this time. Pt assessed per RD 11/2017; identified with severe malnutrition. S/p MBSS per SLP 10/2017; Dys 3-thin liquid diet recommended.  Possible choking/aspiration event.  EMS removed piece of waffle from pt's mouth. Concern for anoxic brain injury.  Labs & medications reviewed. Na 151 (H). Pt's overall prognosis is very poor. CBG's [id number].  Spoke with Joellen Jersey, Therapist, sports.  NUTRITION - FOCUSED PHYSICAL EXAM:    Most Recent Value  Orbital Region  Severe depletion  Upper Arm Region  Severe depletion  Thoracic and Lumbar Region  Unable to assess  Buccal Region  Severe depletion  Temple Region  Severe depletion  Clavicle Bone Region   Severe depletion  Clavicle and Acromion Bone Region  Severe depletion  Scapular Bone Region  Unable to assess  Dorsal Hand  Unable to assess  Patellar Region  Severe depletion  Anterior Thigh Region  Severe depletion  Posterior Calf Region  Severe depletion  Edema (RD Assessment)  None     Diet Order:   Diet Order            Diet NPO time specified  Diet effective now             EDUCATION NEEDS:   Not appropriate for education at this time  Skin:  Skin Assessment: Skin Integrity Issues: Skin Integrity Issues:: Incisions Incisions: hip  Last BM:  N/A  Height:   Ht Readings from Last 1 Encounters:  06/10/2018 5\' 8"  (1.727 m)   Weight:   Wt Readings from Last 1 Encounters:  06/18/18 39.3 kg   BMI:  Body mass index is 13.17 kg/m.  Estimated Nutritional Needs:   Kcal:  1504  Protein:  60-75 gm  Fluid:  per MD  Arthur Holms, RD, LDN Pager #: [phone] After-Hours Pager #: [phone]

## 2018-06-18 NOTE — Progress Notes (Signed)
[place] Progress Note Patient Name: Andre Silva DOB: 1934/11/22 MRN: [id number]   Date of Service  06/18/2018  HPI/Events of Note  Nursing reports anoxic myoclonus vs seizures. EEG not done yet. Versed IV PRN already ordered.   eICU Interventions  Will order: 1. Keppra 1 gm IV now and then 500 mg IV Q 12 hours.      Intervention Category Major Interventions: Other:  Luigi Stuckey Cornelia Copa 06/18/2018, 4:10 AM

## 2018-06-18 NOTE — Progress Notes (Addendum)
NAME:  Andre Silva, MRN:  [id number], DOB:  [birth year], LOS: 1 ADMISSION DATE:  06/13/2018, CONSULTATION DATE:  05/22/2018 REFERRING MD:  Ralene Bathe - ED, CHIEF COMPLAINT:  Post-arrest  Brief History   83 yo male with PMH significant for dementia, TIA, Lung cancer (s/p lobectomy), severe emphysema on CXR presenting to [name] ED 06/06/2018 post-arrest. The patient was last seen normal at 1400 and was found at 1428 to be unresponsive. Patient was unresponsive for unknown period of time. EMS was dispatched. CPR was performed for 30 minutes, initial rhythm asystole, then wide complex PEA. Patient received 5x epi as well as bicarb. Of note, food matter was found by EMS in patient's trachea. Patient was intubated in field after retrieval of food matter.  In ED remained unresponsive despite no administered sedation. Patient is HDS. Patient is hypothermic 31*C despite no cooling measures.  Past Medical History  TIA Dementia (KPS scale 90 in May 2018 with rad onc) Lung cancer - calls to patient have failed HTN HLD syncope Closed hip fracture, left  - may 2019 with DR Ninfa Linden - did nto followup to remove sutures Malnutrition Absence of smell Chronic pain (low back, knee) Low body weight Emphysema on CXR and CT Difficulty chewing due to denture issue and hoarse voice  Significant Hospital Events   12/27> admitted post-arrest  Consults:  PCCM  Procedures:  12/27> intubation (in field by EMS)  Significant Diagnostic Tests:  12/27 ECG> sinus rhythm with non-specific T wave abnormality  12/27 CXR> ETT visualized at thoracic inlet 12/27 CT head>>>   Micro Data:  12/27 Urine Cx >>> 12/27 Blood Cx >>>  Antimicrobials:   Antibiotics Given (last 72 hours)    Date/Time Action Medication Dose Rate   06/05/2018 1833 New Bag/Given   Ampicillin-Sulbactam (UNASYN) 3 g in sodium chloride 0.9 % 100 mL IVPB 3 g 200 mL/hr   06/18/18 3295 New Bag/Given   Ampicillin-Sulbactam (UNASYN) 3 g in sodium  chloride 0.9 % 100 mL IVPB 3 g 200 mL/hr       Interim history/subjective:    12/28 - remains on vent. Myoclonus v seizure reported. Rx Keppra and versed with improvement. Marland Kitchen AKI - with creat 1.65. From hypothermia at admit became febrile overnight to 101F. Not on pressors. On 40% fio2  Objective   Blood pressure (!) 155/91, pulse 97, temperature 99 F (37.2 C), temperature source Bladder, resp. rate 17, height 5\' 8"  (1.727 m), weight 39.3 kg, SpO2 100 %.    Vent Mode: PRVC FiO2 (%):  [40 %-50 %] 40 % Set Rate:  [15 bmp-16 bmp] 15 bmp Vt Set:  [550 mL] 550 mL PEEP:  [5 cmH20] 5 cmH20 Plateau Pressure:  [15 cmH20-22 cmH20] 16 cmH20   Intake/Output Summary (Last 24 hours) at 06/18/2018 1107 Last data filed at 06/18/2018 1100 Gross per 24 hour  Intake 1177.1 ml  Output 820 ml  Net 357.1 ml   Filed Weights   06/02/2018 1800 06/18/18 0500  Weight: 37.6 kg 39.3 kg    General Appearance:  Looks criticall ill. EMACIATED AND FRAIL Head:  Normocephalic, without obvious abnormality, atraumatic Eyes:  PERRL - yes, conjunctiva/corneas - clear     Ears:  Normal external ear canals, both ears Nose:  G tube - no Throat:  ETT TUBE - yes , OG tube - ? Neck:  Supple,  No enlargement/tenderness/nodules Lungs: Clear to auscultation bilaterally, Ventilator   Synchrony - yes. BARRELL CHEST Heart:  S1 and S2 normal, no murmur,  CVP - no.  Pressors - no Abdomen:  Soft, no masses, no organomegaly Genitalia / Rectal:  Not done Extremities:  Extremities- intact, EMACIATED. LEFT HIP - SUTURES from MAY 2019 surgery intact (dr Ninfa Linden) Skin:  ntact in exposed areas . Sacral area - not examined Neurologic:  Sedation - none -> RASS - -4 . Moves all 4s - no. CHEWING MOTION in MOUTH since ADMIT. CAM-ICU - cannot test . Orientation - not oriented      LABS    PULMONARY Recent Labs  Lab 05/23/2018 1544 05/22/2018 1555 06/18/18 0340  PHART 7.405  --  7.560*  PCO2ART 34.2  --  27.1*  PO2ART 404.0*   --  183*  HCO3 21.4  --  24.3  TCO2 22 21*  --   O2SAT 100.0  --  99.2    CBC Recent Labs  Lab 05/29/2018 1555 06/02/2018 1849 06/18/18 0448  HGB 7.8* 10.3* 9.6*  HCT 23.0* 34.3* 31.5*  WBC  --  4.2 7.1  PLT  --  269 255    COAGULATION Recent Labs  Lab 06/12/2018 1849  INR 1.18    CARDIAC   Recent Labs  Lab 05/22/2018 1849 06/18/18 0040 06/18/18 0448  TROPONINI 0.08* 0.25* 0.33*   No results for input(s): PROBNP in the last 168 hours.   CHEMISTRY Recent Labs  Lab 06/12/2018 1555 06/05/2018 1849 06/18/18 0448  NA 151* 150* 151*  K 3.1* 4.0 3.8  CL 117* 115* 114*  CO2  --  23 23  GLUCOSE 110* 164* 130*  BUN 36* 42* 41*  CREATININE 1.10 1.38* 1.65*  CALCIUM  --  8.8* 8.3*  MG  --   --  2.2  PHOS  --   --  3.0   Estimated Creatinine Clearance: 19.5 mL/min (A) (by C-G formula based on SCr of 1.65 mg/dL (H)).   LIVER Recent Labs  Lab 06/06/2018 1849  AST 76*  ALT 50*  ALKPHOS 64  BILITOT 1.1  PROT 6.7  ALBUMIN 3.0*  INR 1.18     INFECTIOUS Recent Labs  Lab 05/24/2018 1556 06/02/2018 1849 06/18/18 0040  LATICACIDVEN 7.24* 3.1* 1.9     ENDOCRINE CBG (last 3)  Recent Labs    06/04/2018 1949 05/31/2018 2351 06/18/18 0338  GLUCAP 184* 148* 107*         IMAGING x48h  - image(s) personally visualized  -   highlighted in bold Ct Head Wo Contrast  Result Date: 06/04/2018 CLINICAL DATA:  Pt here post arrest , asystole first rhythm CPR started 1428, cap 30 went into pea, received 5 epi , pulled waffle out of throat and intubated. EXAM: CT HEAD WITHOUT CONTRAST TECHNIQUE: Contiguous axial images were obtained from the base of the skull through the vertex without intravenous contrast. COMPARISON:  04/25/2018 FINDINGS: Brain: There is central and cortical atrophy. Periventricular white matter changes are consistent with small vessel disease. There is no intra or extra-axial fluid collection or mass lesion. The basilar cisterns and ventricles have a normal  appearance. There is no CT evidence for acute infarction or hemorrhage. Vascular: There is atherosclerotic calcification of the internal carotid arteries. No hyperdense vessels. Skull: Normal. Negative for fracture or focal lesion. Sinuses/Orbits: There is minimal mucosal thickening of the paranasal sinuses. No air-fluid levels. Mastoid air cells are normally aerated. Other: None IMPRESSION: 1. Atrophy and small vessel disease. 2.  No evidence for acute intracranial abnormality. 3. Minimal sinus disease. Electronically Signed   By: Nolon Nations M.D.  On: 05/27/2018 17:40   Dg Chest Port 1 View  Result Date: 06/18/2018 CLINICAL DATA:  Respiratory failure EXAM: PORTABLE CHEST 1 VIEW COMPARISON:  Yesterday FINDINGS: Advanced endotracheal tube with tip 2.7 cm above the carina. An orogastric tube is coiled over the stomach. COPD with emphysematous change. Normal heart size. Postoperative right hilum. No airspace disease, edema, effusion, or pneumothorax. Left lower lobe pulmonary nodule on most recent chest CT 11/05/2017 IMPRESSION: 1. Advanced endotracheal tube with tip 2.7 cm above the carina. 2. Emphysema. 3. Suspicious left lower lobe pulmonary nodule on chest CT 11/05/2017. Electronically Signed   By: Monte Fantasia M.D.   On: 06/18/2018 08:16   Dg Chest Port 1 View  Result Date: 06/04/2018 CLINICAL DATA:  Patient presented with cardiac arrest. Personal history of RIGHT UPPER lobectomy for lung cancer in 2016. EXAM: PORTABLE CHEST 1 VIEW COMPARISON:  11/08/2017 and earlier, including CT chest 11/05/2017. FINDINGS: External pacing pads. Nasogastric tube courses below the diaphragm into the stomach. Nasogastric tube tip is at the thoracic inlet. Cardiac silhouette normal in size for AP portable technique, unchanged. Thoracic aorta mildly atherosclerotic, unchanged. Hilar and mediastinal contours otherwise unremarkable. Severe emphysematous changes throughout both lungs. Postsurgical pleuroparenchymal  scar at the RIGHT base. Lungs otherwise clear. The spiculated nodule in the MEDIAL LEFT LOWER LOBE identified on the prior CT is inconspicuous on the current chest x-ray. IMPRESSION: 1. Endotracheal tube tip at the thoracic inlet. It could be advanced 5 cm or so if needed. 2. Remaining support apparatus satisfactory. 3. No acute cardiopulmonary disease. Previously identified MEDIAL LEFT LOWER LOBE lung nodule inconspicuous on the portable chest x-ray, likely obscured by the external pacing pads. 4.  Emphysema. ([id number]) Electronically Signed   By: Evangeline Dakin M.D.   On: 06/18/2018 16:21     Resolved Hospital Problem list     Assessment & Plan:  Cardiac Arrest - unclear etiology at this point.  Some concern for choke as initial event causing respiratory leading to cardiac arrest (piece of waffle removed from oral cavity by EMS).  Unknown downtime, potentially up to 30 minutes with an additional 30 minutes of ACLS prior to Lloyd.   12/28  - not on pressors. Not a cooling candidate  plan - supportive care   Hx HTN, HLD. - Continue preadmission ASA, atorvastatin. - Hold preadmission lopressor.  Hypernatremia / Hyperchloremia. -  D5 1/2 NS @ 50cc/hr. - Follow BMP.  Anemia - chronic.  No signs of bleeding. - Transfuse for Hgb < 7. - Monitor for bleeding.  Acute encephalopathy with concern for anoxic injury. CT head with chronic changes - Myoclonus v seizure - improved with keppra and versed prn  - start diprivan - await EEG  - if problems persists or goals of care discordance present, call neuro   Malnutrition - at risk for refeeding syndrome - consult nutrition  Possible Aspiration pneumonia - Unasyn  AKI   - LR bolus x 1 and monitor   Best practice:  Diet: Start Enteral Nutrition Pain/Anxiety/Delirium protocol (if indicated): Fentanyl PRN / Midazolam PRN.  RASS goal 0 to -1. VAP protocol (if indicated): In place. DVT prophylaxis: SCD's / heparin. GI  prophylaxis: Pantoprazole. Glucose control: N/A. Mobility: Bedrest. Code Status: Full. Family Communication:   at admit 06/10/2018\-  Pt has 8 children, 1 being a daughter who is a Software engineer in [place] Aniceto Boss [phone].  Leda Gauze will have a teleconference with remainder of siblings regarding goals of care discussions.  Some of siblings who live  near by will be at hospital tonight at some point, Leda Gauze will likely not be able to arrive until Monday.  Family wishes are to continue full code for now until they have an opportunity to have a conference call.  12/28 - none at beside. Overall prognosis is very poor. If there is discordance with withdrawal of care can involve neuro  Disposition: ICU.   ATTESTATION & SIGNATURE   The patient PERLEY ARTHURS is critically ill with multiple organ systems failure and requires high complexity decision making for assessment and support, frequent evaluation and titration of therapies, application of advanced monitoring technologies and extensive interpretation of multiple databases.   Critical Care Time devoted to patient care services described in this note is  30  Minutes. This time reflects time of care of this signee Dr Brand Males. This critical care time does not reflect procedure time, or teaching time or supervisory time of PA/NP/Med student/Med Resident etc but could involve care discussion time     Dr. Brand Males, M.D., [facility].C.P Pulmonary and Critical Care Medicine Staff Physician [facility] Pager: [phone], If no answer or between  15:00h - 7:00h: call [phone]  06/18/2018 11:07 AM

## 2018-06-18 NOTE — Progress Notes (Signed)
  Echocardiogram 2D Echocardiogram has been performed.  Andre Silva 06/18/2018, 3:48 PM

## 2018-06-19 ENCOUNTER — Inpatient Hospital Stay (HOSPITAL_COMMUNITY): Payer: No Typology Code available for payment source

## 2018-06-19 DIAGNOSIS — I469 Cardiac arrest, cause unspecified: Secondary | ICD-10-CM

## 2018-06-19 LAB — BASIC METABOLIC PANEL
Anion gap: 9 (ref 5–15)
BUN: 28 mg/dL — ABNORMAL HIGH (ref 8–23)
CO2: 25 mmol/L (ref 22–32)
Calcium: 8.2 mg/dL — ABNORMAL LOW (ref 8.9–10.3)
Chloride: 115 mmol/L — ABNORMAL HIGH (ref 98–111)
Creatinine, Ser: 1.27 mg/dL — ABNORMAL HIGH (ref 0.61–1.24)
GFR calc Af Amer: >60 mL/min (ref >60)
GFR calc non Af Amer: 53 mL/min — ABNORMAL LOW (ref >60)
Glucose, Bld: 166 mg/dL — ABNORMAL HIGH (ref 70–99)
Potassium: 3.1 mmol/L — ABNORMAL LOW (ref 3.5–5.1)
Sodium: 149 mmol/L — ABNORMAL HIGH (ref 135–145)

## 2018-06-19 LAB — GLUCOSE, CAPILLARY
GLUCOSE-CAPILLARY: 72 mg/dL (ref 70–99)
GLUCOSE-CAPILLARY: 120 mg/dL — AB (ref 70–99)
Glucose-Capillary: 101 mg/dL — ABNORMAL HIGH (ref 70–99)
Glucose-Capillary: 113 mg/dL — ABNORMAL HIGH (ref 70–99)
Glucose-Capillary: 121 mg/dL — ABNORMAL HIGH (ref 70–99)
Glucose-Capillary: 132 mg/dL — ABNORMAL HIGH (ref 70–99)
Glucose-Capillary: 132 mg/dL — ABNORMAL HIGH (ref 70–99)
Glucose-Capillary: 78 mg/dL (ref 70–99)

## 2018-06-19 LAB — MAGNESIUM: Magnesium: 2.1 mg/dL (ref 1.7–2.4)

## 2018-06-19 LAB — PHOSPHORUS: Phosphorus: 3.1 mg/dL (ref 2.5–4.6)

## 2018-06-19 MED ORDER — SODIUM CHLORIDE 0.9 % IV SOLN
2000.0000 mg | INTRAVENOUS | Status: AC
  Administered 2018-06-19: 2000 mg via INTRAVENOUS
  Filled 2018-06-19: qty 20

## 2018-06-19 MED ORDER — DEXTROSE 5 % IV SOLN
INTRAVENOUS | Status: DC
  Administered 2018-06-19 – 2018-06-21 (×4): via INTRAVENOUS

## 2018-06-19 MED ORDER — LORAZEPAM 2 MG/ML IJ SOLN
INTRAMUSCULAR | Status: AC
  Administered 2018-06-19: 2 mg via INTRAVENOUS
  Filled 2018-06-19: qty 1

## 2018-06-19 MED ORDER — LORAZEPAM 2 MG/ML IJ SOLN
INTRAMUSCULAR | Status: AC
  Administered 2018-06-19: 2 mg
  Filled 2018-06-19: qty 1

## 2018-06-19 MED ORDER — LORAZEPAM 2 MG/ML IJ SOLN
2.0000 mg | Freq: Once | INTRAMUSCULAR | Status: AC
  Administered 2018-06-19: 2 mg via INTRAVENOUS

## 2018-06-19 MED ORDER — PHENYLEPHRINE HCL-NACL 10-0.9 MG/250ML-% IV SOLN
INTRAVENOUS | Status: AC
  Filled 2018-06-19: qty 250

## 2018-06-19 MED ORDER — PHENYLEPHRINE HCL-NACL 10-0.9 MG/250ML-% IV SOLN
0.0000 ug/min | INTRAVENOUS | Status: DC
  Administered 2018-06-19 (×2): 20 ug/min via INTRAVENOUS
  Administered 2018-06-20: 40 ug/min via INTRAVENOUS
  Administered 2018-06-20: 70 ug/min via INTRAVENOUS
  Administered 2018-06-20: 100 ug/min via INTRAVENOUS
  Administered 2018-06-20: 50 ug/min via INTRAVENOUS
  Administered 2018-06-20: 45 ug/min via INTRAVENOUS
  Administered 2018-06-20: 50 ug/min via INTRAVENOUS
  Administered 2018-06-20: 70 ug/min via INTRAVENOUS
  Administered 2018-06-20: 90 ug/min via INTRAVENOUS
  Administered 2018-06-20: 70 ug/min via INTRAVENOUS
  Administered 2018-06-21 (×5): 100 ug/min via INTRAVENOUS
  Administered 2018-06-21: 90 ug/min via INTRAVENOUS
  Administered 2018-06-21: 100 ug/min via INTRAVENOUS
  Filled 2018-06-19 (×3): qty 250
  Filled 2018-06-19: qty 500
  Filled 2018-06-19 (×8): qty 250
  Filled 2018-06-19: qty 500
  Filled 2018-06-19 (×2): qty 250

## 2018-06-19 MED ORDER — POTASSIUM CHLORIDE 20 MEQ/15ML (10%) PO SOLN
40.0000 meq | Freq: Once | ORAL | Status: AC
  Administered 2018-06-19: 40 meq
  Filled 2018-06-19: qty 30

## 2018-06-19 NOTE — Consult Note (Addendum)
NEUROLOGY CONSULT  Reason for Consult:Neurologic opinion for HIE prognosis Referring Physician: Dr Chase Caller  CC: unable  HPI: Andre Silva is an 83 y.o. male with baseline dementia, pmh of TIA/stroke with residual dysphagia, HTN, Lung cancer s/p lobectomy yet continues to smoke tobacco. On 12/27 he was found unresponsive by his caregiver. Down time prior to EMS was ~10mn. EMS found him to be asystole and CPR was initiated. Intubation in the field found pt to have a piece of waffle in trachea. ROSC took another ~376m. He was hypothermic 31C upon arrival. No TTM done. CTH done in ER at admit showed no acute changes. He was GCS 3t upon arrival and continues to be so on exam today with myoclonic like rhythmic motion of jaw. EEG shows status epilepticus. We will load with VPA and increase propofol to suppress this at this time. We have attempted to call family, no answer. Apparently the pt has 8 kids that all live in different places and have not been to bedside yet per RN.   Past Medical History Past Medical History:  Diagnosis Date  . Absent sense of smell   . Corns and callosities   . Hypertension   . Intermittent cerebral ischemia   . Knee joint pain   . Low back pain   . [place]  . Problems with swallowing and mastication   . Skin cyst   . Unspecified urinary incontinence     Past Surgical History Past Surgical History:  Procedure Laterality Date  . LOBECTOMY Right 04/15/2015   RUL lobectomy  . TOTAL HIP ARTHROPLASTY Left 11/06/2017   Procedure: TOTAL HIP ARTHROPLASTY ANTERIOR APPROACH;  Surgeon: BlMcarthur RossettiMD;  Location: [facility];  Service: Orthopedics;  Laterality: Left;    Family History Family History  Problem Relation Age of Onset  . Breast cancer Mother     Social History    reports that he has been smoking cigarettes. He has a 17.00 pack-year smoking history. He has never used smokeless tobacco. He reports current alcohol use of about 6.0  standard drinks of alcohol per week. He reports that he does not use drugs.  Allergies No Known Allergies  Home Medications Medications Prior to Admission  Medication Sig Dispense Refill  . aspirin 81 MG chewable tablet Chew 1 tablet (81 mg total) by mouth 2 (two) times daily after a meal. 60 tablet 0  . atorvastatin (LIPITOR) 40 MG tablet Take 40 mg by mouth at bedtime.    . cetirizine (ZYRTEC) 10 MG tablet Take 10 mg by mouth daily.    . Marland KitchenYDROcodone-acetaminophen (NORCO) 5-325 MG tablet Take 1-2 tablets by mouth every 6 (six) hours as needed for moderate pain. 40 tablet 0  . metoprolol tartrate (LOPRESSOR) 50 MG tablet Take 25 mg by mouth 2 (two) times daily.    . Omega-3 Fatty Acids (FISH OIL PO) Take 1 capsule by mouth daily.     . pantoprazole (PROTONIX) 40 MG tablet Take 40 mg by mouth 2 (two) times daily.    . ranitidine (ZANTAC) 300 MG tablet Take 300 mg by mouth at bedtime.    . tamsulosin (FLOMAX) 0.4 MG CAPS capsule Take 0.4 mg by mouth daily.    . Marland Kitcheninc gluconate 50 MG tablet Take 50 mg by mouth daily.      Hospital Medications . aspirin  81 mg Per Tube Daily  . atorvastatin  40 mg Per Tube q1800  . chlorhexidine gluconate (MEDLINE KIT)  15 mL  Mouth Rinse BID  . heparin  5,000 Units Subcutaneous Q8H  . mouth rinse  15 mL Mouth Rinse 10 times per day  . pantoprazole (PROTONIX) IV  40 mg Intravenous QHS     ROS: Unable; pt unresponsive  Physical Examination:  Vitals:   06/19/18 1200 06/19/18 1203 06/19/18 1204 06/19/18 1300  BP: 123/80 123/80  116/77  Pulse: 94 95    Resp: (!) '8 20  17  ' Temp: 98.6 F (37 C)     TempSrc: Bladder     SpO2: 100% 100% 100%   Weight:      Height:        General - appears to be in poor health, critically ill. Emaciated.  Heart - Regular rate and rhythm - no murmer Lungs - Clear to auscultation; vented Abdomen - Soft , not distended Extremities - Distal pulses intact - no edema Skin - Warm and dry  Neurologic Examination:    Mental Status:  GCS 3t. Unresponsive to pain/deep noxious stimuli. Eyes are in a fixed upward gaze. Right pupil is non responsive 60m. Left pupil is very sluggish 161mand irregular. No blink. No OCR. +Dolls. No cough/no gag. Not breathing over vent.   LABORATORY STUDIES:  Basic Metabolic Panel: Recent Labs  Lab 05/26/2018 1555 05/31/2018 1849 06/18/18 0448 06/19/18 0326  NA 151* 150* 151*  --   K 3.1* 4.0 3.8  --   CL 117* 115* 114*  --   CO2  --  23 23  --   GLUCOSE 110* 164* 130*  --   BUN 36* 42* 41*  --   CREATININE 1.10 1.38* 1.65*  --   CALCIUM  --  8.8* 8.3*  --   MG  --   --  2.2 2.1  PHOS  --   --  3.0 3.1    Liver Function Tests: Recent Labs  Lab 06/02/2018 1849  AST 76*  ALT 50*  ALKPHOS 64  BILITOT 1.1  PROT 6.7  ALBUMIN 3.0*   No results for input(s): LIPASE, AMYLASE in the last 168 hours. No results for input(s): AMMONIA in the last 168 hours.  CBC: Recent Labs  Lab 06/16/2018 1555 06/15/2018 1849 06/18/18 0448  WBC  --  4.2 7.1  NEUTROABS  --  3.7  --   HGB 7.8* 10.3* 9.6*  HCT 23.0* 34.3* 31.5*  MCV  --  90.7 90.3  PLT  --  269 255    Cardiac Enzymes: Recent Labs  Lab 05/29/2018 1849 06/18/18 0040 06/18/18 0448  TROPONINI 0.08* 0.25* 0.33*    BNP: Invalid input(s): POCBNP  CBG: Recent Labs  Lab 06/18/18 2308 06/18/18 2310 06/19/18 0345 06/19/18 0802 06/19/18 1145  GLUCAP 33* 81 72 113* 120*    Microbiology:   Coagulation Studies: Recent Labs    05/29/2018 1849  LABPROT 14.9  INR 1.18    Urinalysis:  Recent Labs  Lab 05/30/2018 1650  COLORURINE AMBER*  LABSPEC 1.020  PHURINE 5.0  GLUCOSEU NEGATIVE  HGBUR SMALL*  BILIRUBINUR NEGATIVE  KETONESUR 5*  PROTEINUR 100*  NITRITE NEGATIVE  LEUKOCYTESUR NEGATIVE    Lipid Panel:     Component Value Date/Time   CHOL 149 09/18/2013 0415   TRIG 82 06/18/2018 1618   HDL 57 09/18/2013 0415   CHOLHDL 2.6 09/18/2013 0415   VLDL 10 09/18/2013 0415   LDLCALC 82 09/18/2013  0415    HgbA1C:  Lab Results  Component Value Date   HGBA1C 5.6 09/17/2013  Urine Drug Screen:      Component Value Date/Time   LABOPIA NONE DETECTED 05/29/2018 1650   COCAINSCRNUR NONE DETECTED 06/03/2018 1650   LABBENZ NONE DETECTED 05/25/2018 1650   AMPHETMU NONE DETECTED 06/08/2018 1650   THCU NONE DETECTED 06/16/2018 1650   LABBARB NONE DETECTED 05/25/2018 1650     Alcohol Level:  Recent Labs  Lab 05/26/2018 [place] <10    Miscellaneous labs:  EKG  EKG  IMAGING: Ct Head Wo Contrast  Result Date: 06/16/2018 CLINICAL DATA:  Pt here post arrest , asystole first rhythm CPR started 1428, cap 30 went into pea, received 5 epi , pulled waffle out of throat and intubated. EXAM: CT HEAD WITHOUT CONTRAST TECHNIQUE: Contiguous axial images were obtained from the base of the skull through the vertex without intravenous contrast. COMPARISON:  04/25/2018 FINDINGS: Brain: There is central and cortical atrophy. Periventricular white matter changes are consistent with small vessel disease. There is no intra or extra-axial fluid collection or mass lesion. The basilar cisterns and ventricles have a normal appearance. There is no CT evidence for acute infarction or hemorrhage. Vascular: There is atherosclerotic calcification of the internal carotid arteries. No hyperdense vessels. Skull: Normal. Negative for fracture or focal lesion. Sinuses/Orbits: There is minimal mucosal thickening of the paranasal sinuses. No air-fluid levels. Mastoid air cells are normally aerated. Other: None IMPRESSION: 1. Atrophy and small vessel disease. 2.  No evidence for acute intracranial abnormality. 3. Minimal sinus disease. Electronically Signed   By: Nolon Nations M.D.   On: 05/22/2018 17:40   Dg Chest Port 1 View  Result Date: 06/19/2018 CLINICAL DATA:  ET tube. EXAM: PORTABLE CHEST 1 VIEW COMPARISON:  Chest radiograph 06/18/2018 FINDINGS: ET tube terminates in the distal trachea. Enteric tube courses  inferior to the diaphragm. Monitoring leads overlie the patient. Pacer pads overlie the patient. Stable cardiac and mediastinal contours. Pulmonary hyperinflation. Postsurgical changes right hilum. No pleural effusion or pneumothorax. Suspicious left lower lobe pulmonary nodule demonstrated on prior chest CT. IMPRESSION: Support apparatus as above. Suspicious left lower lobe pulmonary nodule demonstrated best on prior chest CT. Electronically Signed   By: Lovey Newcomer M.D.   On: 06/19/2018 08:41   Dg Chest Port 1 View  Result Date: 06/18/2018 CLINICAL DATA:  Respiratory failure EXAM: PORTABLE CHEST 1 VIEW COMPARISON:  Yesterday FINDINGS: Advanced endotracheal tube with tip 2.7 cm above the carina. An orogastric tube is coiled over the stomach. COPD with emphysematous change. Normal heart size. Postoperative right hilum. No airspace disease, edema, effusion, or pneumothorax. Left lower lobe pulmonary nodule on most recent chest CT 11/05/2017 IMPRESSION: 1. Advanced endotracheal tube with tip 2.7 cm above the carina. 2. Emphysema. 3. Suspicious left lower lobe pulmonary nodule on chest CT 11/05/2017. Electronically Signed   By: Monte Fantasia M.D.   On: 06/18/2018 08:16   Dg Chest Port 1 View  Result Date: 05/30/2018 CLINICAL DATA:  Patient presented with cardiac arrest. Personal history of RIGHT UPPER lobectomy for lung cancer in 2016. EXAM: PORTABLE CHEST 1 VIEW COMPARISON:  11/08/2017 and earlier, including CT chest 11/05/2017. FINDINGS: External pacing pads. Nasogastric tube courses below the diaphragm into the stomach. Nasogastric tube tip is at the thoracic inlet. Cardiac silhouette normal in size for AP portable technique, unchanged. Thoracic aorta mildly atherosclerotic, unchanged. Hilar and mediastinal contours otherwise unremarkable. Severe emphysematous changes throughout both lungs. Postsurgical pleuroparenchymal scar at the RIGHT base. Lungs otherwise clear. The spiculated nodule in the MEDIAL  LEFT LOWER LOBE identified  on the prior CT is inconspicuous on the current chest x-ray. IMPRESSION: 1. Endotracheal tube tip at the thoracic inlet. It could be advanced 5 cm or so if needed. 2. Remaining support apparatus satisfactory. 3. No acute cardiopulmonary disease. Previously identified MEDIAL LEFT LOWER LOBE lung nodule inconspicuous on the portable chest x-ray, likely obscured by the external pacing pads. 4.  Emphysema. ([id number]) Electronically Signed   By: Evangeline Dakin M.D.   On: 06/14/2018 16:21     Assessment/Plan: This is an unfortunate 83yrold man who presented to the ER on 12/27 post Asystole cardiac arrest. There was 3105m of down time prior to EMS and ROSC took another 3065m He was comatose (GCS 3t) upon arrival. No TTM done. He was hypothermic (31C) upon arrival.  # Post Cardiac Arrest- long down time and time to ROSC. Day #2 # Coma- persistent GCS3t since arrival. c/w HIE # Status epilepticus- cEEG. Load with VPA and suppress with propofol # h/o prev stroke/TIA with resulting dementia and dysphagia- severe proteine calorie malnutrition. Debilitated appearance, unsure what baseline functioning was.  # Primary lung cancer s/p lobectomy although he continues to smoke per EMR  RECS: Load with VPA now Suppress seizures with propofol for now Pressors as needed to achieve this Supportive care per GOCHalfwaynsult to assist with large family (8 kids and no HCPOA) Per AHA guidlines, It is too early to get MRI brain or prognosticate at this time, but his persistent coma is not a good sign of meaningful recovery.  We will attempt to treat seizures and maximize medical tx at this time.  Seen with RN and Dr KirLeonel Ramsayttempted to call pts dtr, MarAniceto [phone] answer.   Attending neurologist's note to follow I have seen the patient reviewed the above note.  He has post anoxic status epilepticus, but this is not clearly myoclonic and  therefore not an obvious indicator of poor prognosis.  What is more concerning is his current exam including lack of corneal reflexes.  The absent right pupillary reflex is of unclear significance given the postsurgical nature of this eye.  I discussed with his daughter that I suspect a poor prognosis and asked how aggressive that he would want to be, but at this time they are not able to make a decision because of a large disparate family but they will be here tomorrow.  I will continue burst suppression through tomorrow which point I will lighten him.  This patient is critically ill and at significant risk of neurological worsening, death and care requires constant monitoring of vital signs, hemodynamics,respiratory and cardiac monitoring, neurological assessment, discussion with family, other specialists and medical decision making of high complexity. I spent 50 minutes of neurocritical care time  in the care of  this patient.  This is independent of any time spent by the above nurse practitioner.  McNRoland RackD [facility] [phone] 7pm- 7am, please page neurology on call as listed in [place]2/29/2019  4:32 PM

## 2018-06-19 NOTE — Progress Notes (Signed)
Neuro at bedside to assess pt. 2mg  ativan ordered per MD to control seizure activity noted on EEG as well as repetitive jaw movements. Propofol also increased to control activity as well. Neo ordered to prevent hypotension r/t increase in propofol. Daughter Karie Kirks attempted at this time, did not answer.

## 2018-06-19 NOTE — Procedures (Signed)
History: 83 year old male status post cardiac arrest  Sedation: Propofol 30 mcg/kg/min  Technique: This is a 21 channel routine scalp EEG performed at the bedside with bipolar and monopolar montages arranged in accordance to the international 10/20 system of electrode placement. One channel was dedicated to EKG recording.    Background: At the onset of recording there are generalized polyspike and wave discharges associated with clinical abnormal jaw movements.  The bursts of high frequency polyspike's occur at a frequency of 2 to 3 Hz.  Following treatment with Ativan, there is attenuation of the amplitude and some improvement but there remains epileptiform discharges throughout the recording.  Photic stimulation: Physiologic driving is not performed  EEG Abnormalities: 1) generalized status epilepticus  Clinical Interpretation: This EEG is consistent with generalized subtle-convulsive status epilepticus.  Roland Rack, MD [facility] [phone]  If 7pm- 7am, please page neurology on call as listed in [place].

## 2018-06-19 NOTE — Plan of Care (Signed)

## 2018-06-19 NOTE — Progress Notes (Signed)
Spoke to pt daughter, Karie Kirks, on the phone. Gave update. Karie Kirks stated family would be in Tuesday to have meeting with MD to discuss plan of care.

## 2018-06-19 NOTE — Progress Notes (Signed)
NAME:  Andre Silva, MRN:  [id number], DOB:  1934-06-23, LOS: 2 ADMISSION DATE:  06/19/2018, CONSULTATION DATE:  05/29/2018 REFERRING MD:  Ralene Bathe - ED, CHIEF COMPLAINT:  Post-arrest  Brief History   83 yo male with PMH significant for dementia, TIA, Lung cancer (s/p lobectomy), severe emphysema on CXR presenting to [name] ED 06/03/2018 post-arrest. The patient was last seen normal at 1400 and was found at 1428 to be unresponsive. Patient was unresponsive for unknown period of time. EMS was dispatched. CPR was performed for 30 minutes, initial rhythm asystole, then wide complex PEA. Patient received 5x epi as well as bicarb. Of note, food matter was found by EMS in patient's trachea. Patient was intubated in field after retrieval of food matter.  In ED remained unresponsive despite no administered sedation. Patient is HDS. Patient is hypothermic 31*C despite no cooling measures.   Past Medical History  TIA Dementia (KPS scale 90 in May 2018 with rad onc) Lung cancer - calls to patient have failed HTN HLD syncope Closed hip fracture, left  - may 2019 with DR Ninfa Linden - did nto followup to remove sutures Malnutrition Absence of smell Chronic pain (low back, knee) Low body weight Emphysema on CXR and CT Difficulty chewing due to denture issue and hoarse voice  Significant Hospital Events   12/27> admitted post-arrest  12/28 - remains on vent. Myoclonus v seizure reported. Rx Keppra and versed with improvement. Marland Kitchen AKI - with creat 1.65. From hypothermia at admit became febrile overnight to 101F. Not on pressors. On 40% fio2  Consults:  PCCM  Procedures:  12/27> intubation (in field by EMS)  Significant Diagnostic Tests:  12/27 ECG> sinus rhythm with non-specific T wave abnormality  12/27 CXR> ETT visualized at thoracic inlet 12/27 CT head>>>   Micro Data:  12/27 Urine Cx >>> 12/27 Blood Cx >>> 12/27 MRSA pcr - neg  Antimicrobials:   unasyn 12/27>>    Interim  history/subjective:   12/29 - Per RN - one daughter is a Software engineer in [facility] and reported that family (all 8)  will be here 07/14/18 and decision needs to be taken in unison. Currently, despite diprivan rhythmic eye lid and mouth movements continue, no gag (had gag yesterday), no breathing over vent event at vent RR of 6, and no corneals. FEver improved   Objective   Blood pressure 139/78, pulse 86, temperature (!) 96.6 F (35.9 C), temperature source Bladder, resp. rate 12, height 5\' 8"  (1.727 m), weight 40 kg, SpO2 97 %.    Vent Mode: PRVC FiO2 (%):  [40 %] 40 % Set Rate:  [15 bmp] 15 bmp Vt Set:  [550 mL] 550 mL PEEP:  [5 cmH20] 5 cmH20 Plateau Pressure:  [14 ZYS06-30 cmH20] 14 cmH20   Intake/Output Summary (Last 24 hours) at 06/19/2018 1016 Last data filed at 06/19/2018 1000 Gross per 24 hour  Intake 3065.89 ml  Output 680 ml  Net 2385.89 ml   Filed Weights   06/16/2018 1800 06/18/18 0500 06/19/18 0500  Weight: 37.6 kg 39.3 kg 40 kg   General Appearance:  Looks criticall ill. EMACIATED Head:  Normocephalic, without obvious abnormality, atraumatic Eyes:  PERRL - unable to determine, conjunctiva/corneas - no reflex on squirting saline     Ears:  Normal external ear canals, both ears Nose:  G tube - no Throat:  ETT TUBE - yes , OG tube - y3s Neck:  Supple,  No enlargement/tenderness/nodules Lungs: Clear to auscultation bilaterally, Ventilator   Synchrony -  yes Heart:  S1 and S2 normal, no murmur, CVP - x.  Pressors - no Abdomen:  Soft, no masses, no organomegaly Genitalia / Rectal:  Not done Extremities:  Extremities- intact. LEFT HIP SUTURES + Skin:  ntact in exposed areas .  Neurologic:  Sedation - diprivan -> RASS - -5 . Moves all 4s - no. No CORNEAL. NO GAG. NOT BREATHING OVER VENT (on diprivan)       LABS    PULMONARY Recent Labs  Lab 06/06/2018 1544 05/31/2018 1555 06/18/18 0340  PHART 7.405  --  7.560*  PCO2ART 34.2  --  27.1*  PO2ART 404.0*  --  183*  HCO3  21.4  --  24.3  TCO2 22 21*  --   O2SAT 100.0  --  99.2    CBC Recent Labs  Lab 06/13/2018 1555 06/16/2018 1849 06/18/18 0448  HGB 7.8* 10.3* 9.6*  HCT 23.0* 34.3* 31.5*  WBC  --  4.2 7.1  PLT  --  269 255    COAGULATION Recent Labs  Lab 06/07/2018 1849  INR 1.18    CARDIAC   Recent Labs  Lab 05/27/2018 1849 06/18/18 0040 06/18/18 0448  TROPONINI 0.08* 0.25* 0.33*   No results for input(s): PROBNP in the last 168 hours.   CHEMISTRY Recent Labs  Lab 06/10/2018 1555 06/06/2018 1849 06/18/18 0448 06/19/18 0326  NA 151* 150* 151*  --   K 3.1* 4.0 3.8  --   CL 117* 115* 114*  --   CO2  --  23 23  --   GLUCOSE 110* 164* 130*  --   BUN 36* 42* 41*  --   CREATININE 1.10 1.38* 1.65*  --   CALCIUM  --  8.8* 8.3*  --   MG  --   --  2.2 2.1  PHOS  --   --  3.0 3.1   Estimated Creatinine Clearance: 19.9 mL/min (A) (by C-G formula based on SCr of 1.65 mg/dL (H)).   LIVER Recent Labs  Lab 06/03/2018 1849  AST 76*  ALT 50*  ALKPHOS 64  BILITOT 1.1  PROT 6.7  ALBUMIN 3.0*  INR 1.18     INFECTIOUS Recent Labs  Lab 06/20/2018 1556 06/08/2018 1849 06/18/18 0040  LATICACIDVEN 7.24* 3.1* 1.9     ENDOCRINE CBG (last 3)  Recent Labs    06/18/18 2310 06/19/18 0345 06/19/18 0802  GLUCAP 81 72 113*         IMAGING x48h  - image(s) personally visualized  -   highlighted in bold Ct Head Wo Contrast  Result Date: 05/30/2018 CLINICAL DATA:  Pt here post arrest , asystole first rhythm CPR started 1428, cap 30 went into pea, received 5 epi , pulled waffle out of throat and intubated. EXAM: CT HEAD WITHOUT CONTRAST TECHNIQUE: Contiguous axial images were obtained from the base of the skull through the vertex without intravenous contrast. COMPARISON:  04/25/2018 FINDINGS: Brain: There is central and cortical atrophy. Periventricular white matter changes are consistent with small vessel disease. There is no intra or extra-axial fluid collection or mass lesion. The  basilar cisterns and ventricles have a normal appearance. There is no CT evidence for acute infarction or hemorrhage. Vascular: There is atherosclerotic calcification of the internal carotid arteries. No hyperdense vessels. Skull: Normal. Negative for fracture or focal lesion. Sinuses/Orbits: There is minimal mucosal thickening of the paranasal sinuses. No air-fluid levels. Mastoid air cells are normally aerated. Other: None IMPRESSION: 1. Atrophy and small vessel disease. 2.  No evidence for acute intracranial abnormality. 3. Minimal sinus disease. Electronically Signed   By: Nolon Nations M.D.   On: 06/10/2018 17:40   Dg Chest Port 1 View  Result Date: 06/19/2018 CLINICAL DATA:  ET tube. EXAM: PORTABLE CHEST 1 VIEW COMPARISON:  Chest radiograph 06/18/2018 FINDINGS: ET tube terminates in the distal trachea. Enteric tube courses inferior to the diaphragm. Monitoring leads overlie the patient. Pacer pads overlie the patient. Stable cardiac and mediastinal contours. Pulmonary hyperinflation. Postsurgical changes right hilum. No pleural effusion or pneumothorax. Suspicious left lower lobe pulmonary nodule demonstrated on prior chest CT. IMPRESSION: Support apparatus as above. Suspicious left lower lobe pulmonary nodule demonstrated best on prior chest CT. Electronically Signed   By: Lovey Newcomer M.D.   On: 06/19/2018 08:41   Dg Chest Port 1 View  Result Date: 06/18/2018 CLINICAL DATA:  Respiratory failure EXAM: PORTABLE CHEST 1 VIEW COMPARISON:  Yesterday FINDINGS: Advanced endotracheal tube with tip 2.7 cm above the carina. An orogastric tube is coiled over the stomach. COPD with emphysematous change. Normal heart size. Postoperative right hilum. No airspace disease, edema, effusion, or pneumothorax. Left lower lobe pulmonary nodule on most recent chest CT 11/05/2017 IMPRESSION: 1. Advanced endotracheal tube with tip 2.7 cm above the carina. 2. Emphysema. 3. Suspicious left lower lobe pulmonary nodule on  chest CT 11/05/2017. Electronically Signed   By: Monte Fantasia M.D.   On: 06/18/2018 08:16   Dg Chest Port 1 View  Result Date: 06/02/2018 CLINICAL DATA:  Patient presented with cardiac arrest. Personal history of RIGHT UPPER lobectomy for lung cancer in 2016. EXAM: PORTABLE CHEST 1 VIEW COMPARISON:  11/08/2017 and earlier, including CT chest 11/05/2017. FINDINGS: External pacing pads. Nasogastric tube courses below the diaphragm into the stomach. Nasogastric tube tip is at the thoracic inlet. Cardiac silhouette normal in size for AP portable technique, unchanged. Thoracic aorta mildly atherosclerotic, unchanged. Hilar and mediastinal contours otherwise unremarkable. Severe emphysematous changes throughout both lungs. Postsurgical pleuroparenchymal scar at the RIGHT base. Lungs otherwise clear. The spiculated nodule in the MEDIAL LEFT LOWER LOBE identified on the prior CT is inconspicuous on the current chest x-ray. IMPRESSION: 1. Endotracheal tube tip at the thoracic inlet. It could be advanced 5 cm or so if needed. 2. Remaining support apparatus satisfactory. 3. No acute cardiopulmonary disease. Previously identified MEDIAL LEFT LOWER LOBE lung nodule inconspicuous on the portable chest x-ray, likely obscured by the external pacing pads. 4.  Emphysema. ([id number]) Electronically Signed   By: Evangeline Dakin M.D.   On: 06/16/2018 16:21     Resolved Hospital Problem list     Assessment & Plan:  Cardiac Arrest - unclear etiology at this point.  Some concern for choke as initial event causing respiratory leading to cardiac arrest (piece of waffle removed from oral cavity by EMS).  Unknown downtime, potentially up to 30 minutes with an additional 30 minutes of ACLS prior to [place].   12/29 - not on pressors.    plan - supportive care   Hx HTN, HLD. - Continue preadmission ASA, atorvastatin. - Hold preadmission lopressor.  Hypernatremia / Hyperchloremia. -  D5 1/2 NS @ 50cc/hr.-> chagne  to d5 water - Follow BMP.  Anemia - chronic.  No signs of bleeding. - Transfuse for Hgb < 7. - Monitor for bleeding.  Acute encephalopathy with concern for anoxic injury. CT head with chronic changes - Myoclonus v seizure - ongoing despite diprivan and keppra - EEG still pending  - Neuro called  Malnutrition - at risk for refeeding syndrome; on TF since 06/18/18 - monitor electrolytes  Possible Aspiration pneumonia - Unasyn to continue  AKI   -recheck bmet  Best practice:  Diet: Start Enteral Nutrition Pain/Anxiety/Delirium protocol (if indicated): Fentanyl PRN / Midazolam PRN.  RASS goal 0 to -1. VAP protocol (if indicated): In place. DVT prophylaxis: SCD's / heparin. GI prophylaxis: Pantoprazole. Glucose control: N/A. Mobility: Bedrest. Code Status: Full. Family Communication:   at admit 06/19/2018\-  Pt has 8 children, 1 being a daughter who is a Software engineer in [place] Andre Silva [phone].  Andre Silva will have a teleconference with remainder of siblings regarding goals of care discussions.  Some of siblings who live near by will be at hospital tonight at some point, Andre Silva will likely not be able to arrive until Monday.  Family wishes are to continue full code for now until they have an opportunity to have a conference call.  12/28 and 12/29  - none at beside. They are expected 2018/06/28 per RN  Disposition: ICU.    ATTESTATION & SIGNATURE   The patient Andre Silva is critically ill with multiple organ systems failure and requires high complexity decision making for assessment and support, frequent evaluation and titration of therapies, application of advanced monitoring technologies and extensive interpretation of multiple databases.   Critical Care Time devoted to patient care services described in this note is  30  Minutes. This time reflects time of care of this signee Dr Brand Males. This critical care time does not reflect procedure time, or  teaching time or supervisory time of PA/NP/Med student/Med Resident etc but could involve care discussion time     Dr. Brand Males, M.D., [facility].C.P Pulmonary and Critical Care Medicine Staff Physician [facility] Pager: [phone], If no answer or between  15:00h - 7:00h: call [phone]  06/19/2018 11:44 AM

## 2018-06-19 NOTE — Progress Notes (Signed)
Pt daughter Karie Kirks called unit, updated on pt condition. Neuro paged to be made aware that daughter is able to be contacted for update at this time.

## 2018-06-20 ENCOUNTER — Inpatient Hospital Stay (HOSPITAL_COMMUNITY): Payer: No Typology Code available for payment source

## 2018-06-20 DIAGNOSIS — J9601 Acute respiratory failure with hypoxia: Principal | ICD-10-CM

## 2018-06-20 DIAGNOSIS — G40901 Epilepsy, unspecified, not intractable, with status epilepticus: Secondary | ICD-10-CM

## 2018-06-20 DIAGNOSIS — G931 Anoxic brain damage, not elsewhere classified: Secondary | ICD-10-CM

## 2018-06-20 DIAGNOSIS — E876 Hypokalemia: Secondary | ICD-10-CM

## 2018-06-20 LAB — CBC WITH DIFFERENTIAL/PLATELET
Abs Immature Granulocytes: 0.04 10*3/uL (ref 0.00–0.07)
BASOS PCT: 0 %
Basophils Absolute: 0 10*3/uL (ref 0.0–0.1)
Eosinophils Absolute: 0 10*3/uL (ref 0.0–0.5)
Eosinophils Relative: 1 %
HCT: 24.7 % — ABNORMAL LOW (ref 39.0–52.0)
Hemoglobin: 8 g/dL — ABNORMAL LOW (ref 13.0–17.0)
Immature Granulocytes: 1 %
Lymphocytes Relative: 27 %
Lymphs Abs: 1.3 10*3/uL (ref 0.7–4.0)
MCH: 29.1 pg (ref 26.0–34.0)
MCHC: 32.4 g/dL (ref 30.0–36.0)
MCV: 89.8 fL (ref 80.0–100.0)
Monocytes Absolute: 0.2 10*3/uL (ref 0.1–1.0)
Monocytes Relative: 4 %
Neutro Abs: 3.1 10*3/uL (ref 1.7–7.7)
Neutrophils Relative %: 67 %
Platelets: 222 10*3/uL (ref 150–400)
RBC: 2.75 MIL/uL — ABNORMAL LOW (ref 4.22–5.81)
RDW: 19.1 % — AB (ref 11.5–15.5)
WBC: 4.6 10*3/uL (ref 4.0–10.5)
nRBC: 0.4 % — ABNORMAL HIGH (ref 0.0–0.2)

## 2018-06-20 LAB — BASIC METABOLIC PANEL
Anion gap: 10 (ref 5–15)
BUN: 24 mg/dL — ABNORMAL HIGH (ref 8–23)
CALCIUM: 7.5 mg/dL — AB (ref 8.9–10.3)
CO2: 22 mmol/L (ref 22–32)
Chloride: 114 mmol/L — ABNORMAL HIGH (ref 98–111)
Creatinine, Ser: 1.43 mg/dL — ABNORMAL HIGH (ref 0.61–1.24)
GFR calc Af Amer: 53 mL/min — ABNORMAL LOW (ref >60)
GFR calc non Af Amer: 46 mL/min — ABNORMAL LOW (ref >60)
Glucose, Bld: 124 mg/dL — ABNORMAL HIGH (ref 70–99)
Potassium: 2.6 mmol/L — CL (ref 3.5–5.1)
Sodium: 146 mmol/L — ABNORMAL HIGH (ref 135–145)

## 2018-06-20 LAB — GLUCOSE, CAPILLARY
Glucose-Capillary: 112 mg/dL — ABNORMAL HIGH (ref 70–99)
Glucose-Capillary: 115 mg/dL — ABNORMAL HIGH (ref 70–99)
Glucose-Capillary: 119 mg/dL — ABNORMAL HIGH (ref 70–99)
Glucose-Capillary: 137 mg/dL — ABNORMAL HIGH (ref 70–99)
Glucose-Capillary: 146 mg/dL — ABNORMAL HIGH (ref 70–99)
Glucose-Capillary: 197 mg/dL — ABNORMAL HIGH (ref 70–99)

## 2018-06-20 LAB — PHOSPHORUS: Phosphorus: 2 mg/dL — ABNORMAL LOW (ref 2.5–4.6)

## 2018-06-20 LAB — MAGNESIUM: Magnesium: 1.9 mg/dL (ref 1.7–2.4)

## 2018-06-20 MED ORDER — POTASSIUM CHLORIDE 20 MEQ/15ML (10%) PO SOLN
40.0000 meq | Freq: Once | ORAL | Status: AC
  Administered 2018-06-20: 40 meq
  Filled 2018-06-20: qty 30

## 2018-06-20 MED ORDER — POTASSIUM PHOSPHATES 15 MMOLE/5ML IV SOLN
20.0000 mmol | Freq: Once | INTRAVENOUS | Status: AC
  Administered 2018-06-20: 20 mmol via INTRAVENOUS
  Filled 2018-06-20: qty 6.67

## 2018-06-20 MED ORDER — VITAL AF 1.2 CAL PO LIQD: 1000.0000 mL | ORAL | Status: DC

## 2018-06-20 MED ORDER — VITAL HIGH PROTEIN PO LIQD
1000.0000 mL | ORAL | Status: DC
  Administered 2018-06-20: 1000 mL

## 2018-06-20 MED ORDER — MAGNESIUM SULFATE IN D5W 1-5 GM/100ML-% IV SOLN
1.0000 g | Freq: Once | INTRAVENOUS | Status: AC
  Administered 2018-06-20: 1 g via INTRAVENOUS
  Filled 2018-06-20: qty 100

## 2018-06-20 NOTE — Progress Notes (Signed)
Nutrition Follow-up  DOCUMENTATION CODES:   Severe malnutrition in context of chronic illness, Underweight  INTERVENTION:   Tube Feeding:  Vital High Protein @ 30 ml/hr Provides 720 kcals, 63 g of protein and 605 mL of free water  TF regimen and propofol at current rate providing 1343 total kcal/day (100 % of kcal needs)   NUTRITION DIAGNOSIS:   Severe Malnutrition related to chronic illness(hx of lung cancer, severe emphysema) as evidenced by severe fat depletion, severe muscle depletion.  Being addressed via TF   GOAL:   Patient will meet greater than or equal to 90% of their needs  Progressing  MONITOR:   Vent status, TF tolerance, Labs, Skin, Weight trends, I & O's  REASON FOR ASSESSMENT:   Consult Enteral/tube feeding initiation and management  ASSESSMENT:   83 yo Male with PMH significant for dementia, TIA, Lung cancer (s/p lobectomy), severe emphysema on CXR presenting to [name] ED 06/07/2018 post-arrest.  Patient is currently intubated on ventilator support, now on pressors, phenylephrine MV: 9.6 L/min Temp (24hrs), Avg:97.5 F (36.4 C), Min:96.1 F (35.6 C), Max:99.2 F (37.3 C)  Propofol: 23.6 ml/hr (623 kcals)  Jevity 1.2 infusing at 45 ml/hr  Admission wt of 39.3 kg; current wt 42.7 kg.  Net +6 L per I/P flow sheet  Potassium and phosphorus being supplemented  Labs: potassium 2.6 (L), phosphorus 2.0 (L), sodium 146 (H), Creatinine 1.43, BUN 24, CBGs 112-197 Meds: D5 at 50 ml/hr  Diet Order:   Diet Order            Diet NPO time specified  Diet effective now              EDUCATION NEEDS:   Not appropriate for education at this time  Skin:  Skin Assessment: Skin Integrity Issues: Skin Integrity Issues:: Incisions Incisions: hip  Last BM:  N/A  Height:   Ht Readings from Last 1 Encounters:  05/23/2018 5\' 8"  (1.727 m)    Weight:   Wt Readings from Last 1 Encounters:  06/20/18 42.7 kg    Ideal Body Weight:     BMI:   Body mass index is 14.31 kg/m.  Estimated Nutritional Needs:   Kcal:  1344 kcals   Protein:  60-75 gm  Fluid:  per MD   Kerman Passey MS, RD, LDN, CNSC [phone] Pager  [phone] Weekend/On-Call Pager

## 2018-06-20 NOTE — Progress Notes (Signed)
Pharmacy Antibiotic Note  Andre Silva is a 83 y.o. male s/p PEA arrest with persistent coma.He is on unasyn day 28for concern of aspiration PNA.   Pharmacy has been consulted for Unasyn dosing. -WBC= 4.6, afeb, CrCl ~ 25, cultures negative  Plan: Unasyn 3g q 12 hrs (consider a total of 7 days?) F/u cultures, renal function, and clinical course  Height: 5\' 8"  (172.7 cm) Weight: 94 lb 2.2 oz (42.7 kg) IBW/kg (Calculated) : 68.4  Temp (24hrs), Avg:97.7 F (36.5 C), Min:96.1 F (35.6 C), Max:99.2 F (37.3 C)  Recent Labs  Lab 06/16/2018 1555 06/11/2018 1556 05/29/2018 1849 06/18/18 0040 06/18/18 0448 06/19/18 1240 06/20/18 0349  WBC  --   --  4.2  --  7.1  --  4.6  CREATININE 1.10  --  1.38*  --  1.65* 1.27* 1.43*  LATICACIDVEN  --  7.24* 3.1* 1.9  --   --   --     Estimated Creatinine Clearance: 24.5 mL/min (A) (by C-G formula based on SCr of 1.43 mg/dL (H)).    No Known Allergies  Antimicrobials this admission: Unasyn 12/27 >   Dose adjustments this admission:  Microbiology results: 12/27 BCx x 2:  12/27 UCx:  12/27 Sputum:   Thank you for allowing pharmacy to be a part of this patient's care.  Hildred Laser, PharmD Clinical Pharmacist **Pharmacist phone directory can now be found on [email or web address] ([email or web address]).  Listed under [place].

## 2018-06-20 NOTE — Procedures (Signed)
LTM-EEG Report  HISTORY: Continuous video-EEG monitoring performed for 83 year old with cardiac arrest, abnormal EEG/seizures. ACQUISITION: International 10-20 system for electrode placement; 18 channels with additional eyes linked to ipsilateral ears and EKG. Additional T1-T2 electrodes were used. Continuous video recording obtained.   EEG NUMBER:  MEDICATIONS:  Day 1: see EMR  DAY #1: from 1259 06/19/18 to 0730 06/20/18  BACKGROUND: An overall low voltage suppressed recording with poor spontaneous variability and absent reactivity. The background consisted of burst-suppression primarily with bursts of 2-5Hz  activity bilaterally every 5-10 seconds. There was emergence of epileptiform activity around 1800 as sedation dissipated (described below).  EPILEPTIFORM/PERIODIC ACTIVITY: There was emergence of bilateral, left maximal, spikes and polyspikes occurring nearly continuously, later occurring every 2-4 seconds with periods of attenuation in between. There were no clinical signs. SEIZURES: Frequent subclinical polyspikes as above, maximal on the left. EVENTS: none  EKG: no significant arrhythmia  SUMMARY: This was a markedly abnormal continuous video EEG due to background suppression and diffuse, left maximal, epileptiform discharges. The polyspikes were nearly continuous in the late afternoon/early evening but now occur every few seconds with periods of attenuation intervening.

## 2018-06-20 NOTE — Progress Notes (Signed)
EEG maint. Completed. No skin break down under Cz, O1,O2, P3, P4, T3, R EKG.

## 2018-06-20 NOTE — Progress Notes (Signed)
NAME:  Andre Silva, MRN:  [id number], DOB:  May 17, 1935, LOS: 3 ADMISSION DATE:  06/19/2018, CONSULTATION DATE:  05/23/2018 REFERRING MD:  Ralene Bathe - ED, CHIEF COMPLAINT:  Post-arrest  Brief History   83 yo male with PMH significant for dementia, TIA, Lung cancer (s/p lobectomy), severe emphysema on CXR presenting to [name] ED 06/16/2018 post-arrest. The patient was last seen normal at 1400 and was found at 1428 to be unresponsive. Patient was unresponsive for unknown period of time. EMS was dispatched. CPR was performed for 30 minutes, initial rhythm asystole, then wide complex PEA. Patient received 5x epi as well as bicarb. Of note, food matter was found by EMS in patient's trachea. Patient was intubated in field after retrieval of food matter.  In ED remained unresponsive despite no administered sedation. Patient is HDS. Patient is hypothermic 31*C despite no cooling measures.   Past Medical History  TIA Dementia (KPS scale 90 in May 2018 with rad onc) Lung cancer - calls to patient have failed HTN HLD syncope Closed hip fracture, left  - may 2019 with DR Ninfa Linden - did nto followup to remove sutures Malnutrition Absence of smell Chronic pain (low back, knee) Low body weight Emphysema on CXR and CT Difficulty chewing due to denture issue and hoarse voice  Significant Hospital Events   12/27> admitted post-arrest  12/28 - remains on vent. Myoclonus v seizure reported. Rx Keppra and versed with improvement. Marland Kitchen AKI - with creat 1.65. From hypothermia at admit became febrile overnight to 101F. Not on pressors. On 40% fio2  Consults:  PCCM  Procedures:  12/27> intubation (in field by EMS)  Significant Diagnostic Tests:  12/27 ECG> sinus rhythm with non-specific T wave abnormality  12/27 CXR> ETT visualized at thoracic inlet 12/27 CT head>>>   Micro Data:  12/27 Urine Cx >>> 12/27 Blood Cx >>> 12/27 MRSA pcr - neg  Antimicrobials:   unasyn 12/27>>    Interim  history/subjective:   12/29 - Per RN - one daughter is a Software engineer in [facility] and reported that family (all 8)  will be here 07/12/18 and decision needs to be taken in unison. Currently, despite diprivan rhythmic eye lid and mouth movements continue, no gag (had gag yesterday), no breathing over vent event at vent RR of 6, and no corneals. FEver improved  12/30 Family has requested for family meeting for 12/31 at 11:30 AM. Patient remains unresponsive on vent.  Objective   Blood pressure 104/65, pulse 72, temperature 98.4 F (36.9 C), temperature source Core, resp. rate 16, height 5\' 8"  (1.727 m), weight 42.7 kg, SpO2 99 %.    Vent Mode: PRVC FiO2 (%):  [40 %-42 %] 40 % Set Rate:  [15 bmp] 15 bmp Vt Set:  [550 mL] 550 mL PEEP:  [5 cmH20] 5 cmH20 Plateau Pressure:  [17 cmH20-20 cmH20] 19 cmH20   Intake/Output Summary (Last 24 hours) at 06/20/2018 1415 Last data filed at 06/20/2018 1400 Gross per 24 hour  Intake 3945.44 ml  Output 675 ml  Net 3270.44 ml   Filed Weights   06/18/18 0500 06/19/18 0500 06/20/18 0452  Weight: 39.3 kg 40 kg 42.7 kg   Physical Exam: General: Unresponsive, laying in bed HENT: [place], AT, intubated Respiratory: Clear to auscultation bilaterally.  No crackles, wheezing or rales Cardiovascular: RRR, -M/R/G, no JVD GI: BS+, soft, nontender Extremities:-Edema,-tenderness Neuro: Unresponsive, no withdrawal to pain, no spontaneous movement Skin: Intact, no rashes or bruising GU: Foley in place  LABS  PULMONARY Recent Labs  Lab 06/12/2018 1544 06/10/2018 1555 06/18/18 0340  PHART 7.405  --  7.560*  PCO2ART 34.2  --  27.1*  PO2ART 404.0*  --  183*  HCO3 21.4  --  24.3  TCO2 22 21*  --   O2SAT 100.0  --  99.2    CBC Recent Labs  Lab 05/28/2018 1849 06/18/18 0448 06/20/18 0349  HGB 10.3* 9.6* 8.0*  HCT 34.3* 31.5* 24.7*  WBC 4.2 7.1 4.6  PLT 269 255 222    COAGULATION Recent Labs  Lab 05/26/2018 1849  INR 1.18    CARDIAC   Recent Labs  Lab  06/15/2018 1849 06/18/18 0040 06/18/18 0448  TROPONINI 0.08* 0.25* 0.33*   No results for input(s): PROBNP in the last 168 hours.   CHEMISTRY Recent Labs  Lab 06/15/2018 1555 05/24/2018 1849 06/18/18 0448 06/19/18 0326 06/19/18 1240 06/20/18 0349  NA 151* 150* 151*  --  149* 146*  K 3.1* 4.0 3.8  --  3.1* 2.6*  CL 117* 115* 114*  --  115* 114*  CO2  --  23 23  --  25 22  GLUCOSE 110* 164* 130*  --  166* 124*  BUN 36* 42* 41*  --  28* 24*  CREATININE 1.10 1.38* 1.65*  --  1.27* 1.43*  CALCIUM  --  8.8* 8.3*  --  8.2* 7.5*  MG  --   --  2.2 2.1  --  1.9  PHOS  --   --  3.0 3.1  --  2.0*   Estimated Creatinine Clearance: 24.5 mL/min (A) (by C-G formula based on SCr of 1.43 mg/dL (H)).   LIVER Recent Labs  Lab 05/22/2018 1849  AST 76*  ALT 50*  ALKPHOS 64  BILITOT 1.1  PROT 6.7  ALBUMIN 3.0*  INR 1.18     INFECTIOUS Recent Labs  Lab 05/27/2018 1556 06/08/2018 1849 06/18/18 0040  LATICACIDVEN 7.24* 3.1* 1.9     ENDOCRINE CBG (last 3)  Recent Labs    06/20/18 0359 06/20/18 0745 06/20/18 1124  GLUCAP 112* 197* 119*         IMAGING x48h  - image(s) personally visualized by myself Dg Chest Port 1 View  Result Date: 06/20/2018 CLINICAL DATA:  Intubation. EXAM: PORTABLE CHEST 1 VIEW COMPARISON:  06/19/2018.  CT 11/05/2017 FINDINGS: Endotracheal tube tip again terminates in the distal trachea, proximal repositioning of approximately 2 cm should be considered. NG tube in stable position. Heart size normal. Postsurgical changes right lung again noted. Mild atelectasis left lung base. Previously identified left lung nodule best identified by prior CT. Small right pleural effusion versus pleural scarring again noted. No pneumothorax. IMPRESSION: 1. Endotracheal tube again terminates in the distal trachea, proximally position proximally 2 cm should be considered. NG tube stable position. 2. Postsurgical changes right lung again noted. Mild atelectasis left lung base.  Previously identified left lung nodule best identified by prior CT. 3. Small right pleural effusion versus pleural scarring again noted. Electronically Signed   By: Marcello Moores  Register   On: 06/20/2018 06:36   Dg Chest Port 1 View  Result Date: 06/19/2018 CLINICAL DATA:  ET tube. EXAM: PORTABLE CHEST 1 VIEW COMPARISON:  Chest radiograph 06/18/2018 FINDINGS: ET tube terminates in the distal trachea. Enteric tube courses inferior to the diaphragm. Monitoring leads overlie the patient. Pacer pads overlie the patient. Stable cardiac and mediastinal contours. Pulmonary hyperinflation. Postsurgical changes right hilum. No pleural effusion or pneumothorax. Suspicious left lower lobe pulmonary nodule demonstrated  on prior chest CT. IMPRESSION: Support apparatus as above. Suspicious left lower lobe pulmonary nodule demonstrated best on prior chest CT. Electronically Signed   By: Lovey Newcomer M.D.   On: 06/19/2018 08:41     Resolved Hospital Problem list     Assessment & Plan:   Cardiac Arrest complicated by status epilepticus in setting of suspected anoxic brain injury Unclear etiology. Some concern for choke as initial event causing respiratory leading to cardiac arrest (piece of waffle removed from oral cavity by EMS).  Unknown downtime, potentially up to 30 minutes with an additional 30 minutes of ACLS prior to ROSC. plan -Management of status epilepticus as noted below  Status Epilepticus secondary to anoxic brain injury -Neurology following. Appreciate the recs -Continue propofol for seizure suppression -Continue Keppra  Acute respiratory failure in setting of cardiac arrest -Continue mechanical ventilation -Not eligible for SBT due to mental status  Hypernatremia / Hyperchloremia. -  Continue D5 gtt - Follow BMP.  Hypokalemia - Trend BMP - Replete per protocol  Chronic anemia - CBC dialy - Transfuse for Hgb < 7 - Monitor for bleeding  Malnutrition - at risk for refeeding  syndrome; on TF since 06/18/18 - monitor electrolytes  Possible Aspiration pneumonia -Continue Unasyn  AKI -Serial BMP  Hx HTN, HLD. - Continue preadmission ASA, atorvastatin. - Hold preadmission lopressor.  Best practice:  Diet: Start Enteral Nutrition Pain/Anxiety/Delirium protocol (if indicated): Fentanyl PRN / Midazolam PRN.  RASS goal 0 to -1. VAP protocol (if indicated): In place. DVT prophylaxis: SCD's / heparin. GI prophylaxis: Pantoprazole. Glucose control: N/A. Mobility: Bedrest. Code Status: Full. Family Communication: Family meeting scheduled for 12/31 @ 1130 AM   at admit 06/02/2018\-  Pt has 8 children, 1 being a daughter who is a Software engineer in [place] [employer] [phone].  Leda Gauze will have a teleconference with remainder of siblings regarding goals of care discussions.  Some of siblings who live near by will be at hospital tonight at some point, Leda Gauze will likely not be able to arrive until Monday.  Family wishes are to continue full code for now until they have an opportunity to have a conference call.  Disposition: Remain in ICU  ATTESTATION & SIGNATURE   The patient is critically ill with multiple organ systems failure and requires high complexity decision making for assessment and support, frequent evaluation and titration of therapies, application of advanced monitoring technologies and extensive interpretation of multiple databases.   Critical Care Time devoted to patient care services described in this note is  32 Minutes. This time reflects time of care of this signee Dr. Rodman Pickle. This critical care time does not reflect procedure time, or teaching time or supervisory time of PA/NP/Med student/Med Resident etc but could involve care discussion time.  Rodman Pickle, M.D. [facility] Pulmonary/Critical Care Medicine. Pager: [phone]. After hours pager: [phone].

## 2018-06-20 NOTE — Progress Notes (Addendum)
Subjective: Initially burst suppressed, followed by bursts of GPEDs.   Exam: Vitals:   06/20/18 0800 06/20/18 0900  BP: (!) 88/59 96/62  Pulse: (!) 57 61  Resp: 17 17  Temp:    SpO2: 100% 100%   Gen: In bed, intubated Resp: ventilated.  Abd: soft, nt  Neuro limited  MS: does not  MG:QQPYPP not clearly reactive, but the left pupil which was reactive yesterday is very small and irregular, making reactivity difficult to determine. corneals absent.  Motor: no movements.    Impression: 83 yo M with post-cardiac arrest status epilepticus due to anoxic brain inury. He has been comatose since arrival. This status was not clearly myoclonic, so alone is not definite poor prognosis, just highly suggestive. Coupled with the refractory nature and dismal exam, as well as his poor baseline, I do not think that he has any significant chance at recovery to independent function. Will continue suppressing seizure activity until family meetings.   Recommendations: 1) Continue propofol for seizure suppression. 2 2) continue keppra 3) family meeting once they arrive this afternoon.   This patient is critically ill and at significant risk of neurological worsening, death and care requires constant monitoring of vital signs, hemodynamics,respiratory and cardiac monitoring, neurological assessment, discussion with family, other specialists and medical decision making of high complexity. I spent 40 minutes of neurocritical care time  in the care of  this patient.  Roland Rack, MD [facility] [phone]  If 7pm- 7am, please page neurology on call as listed in [place]. 06/20/2018  9:43 AM

## 2018-06-21 DIAGNOSIS — I9589 Other hypotension: Secondary | ICD-10-CM

## 2018-06-21 LAB — CBC WITH DIFFERENTIAL/PLATELET
Abs Immature Granulocytes: 0.03 10*3/uL (ref 0.00–0.07)
Basophils Absolute: 0 10*3/uL (ref 0.0–0.1)
Basophils Relative: 1 %
Eosinophils Absolute: 0.1 10*3/uL (ref 0.0–0.5)
Eosinophils Relative: 1 %
HCT: 26.7 % — ABNORMAL LOW (ref 39.0–52.0)
Hemoglobin: 8 g/dL — ABNORMAL LOW (ref 13.0–17.0)
Immature Granulocytes: 1 %
Lymphocytes Relative: 18 %
Lymphs Abs: 0.8 10*3/uL (ref 0.7–4.0)
MCH: 27.4 pg (ref 26.0–34.0)
MCHC: 30 g/dL (ref 30.0–36.0)
MCV: 91.4 fL (ref 80.0–100.0)
MONO ABS: 0.3 10*3/uL (ref 0.1–1.0)
Monocytes Relative: 6 %
NEUTROS ABS: 3.1 10*3/uL (ref 1.7–7.7)
Neutrophils Relative %: 73 %
Platelets: 206 10*3/uL (ref 150–400)
RBC: 2.92 MIL/uL — ABNORMAL LOW (ref 4.22–5.81)
RDW: 19.6 % — ABNORMAL HIGH (ref 11.5–15.5)
WBC Morphology: INCREASED
WBC: 4.3 10*3/uL (ref 4.0–10.5)
nRBC: 1.6 % — ABNORMAL HIGH (ref 0.0–0.2)

## 2018-06-21 LAB — TRIGLYCERIDES: TRIGLYCERIDES: 40 mg/dL (ref <150)

## 2018-06-21 LAB — GLUCOSE, CAPILLARY
Glucose-Capillary: 110 mg/dL — ABNORMAL HIGH (ref 70–99)
Glucose-Capillary: 84 mg/dL (ref 70–99)

## 2018-06-21 LAB — PHOSPHORUS: Phosphorus: 2.4 mg/dL — ABNORMAL LOW (ref 2.5–4.6)

## 2018-06-21 LAB — MAGNESIUM: Magnesium: 1.9 mg/dL (ref 1.7–2.4)

## 2018-06-21 MED ORDER — DIPHENHYDRAMINE HCL 50 MG/ML IJ SOLN: 25.0000 mg | INTRAMUSCULAR | Status: DC | PRN

## 2018-06-21 MED ORDER — MORPHINE SULFATE (PF) 2 MG/ML IV SOLN: 2.0000 mg | INTRAVENOUS | Status: DC | PRN

## 2018-06-21 MED ORDER — ACETAMINOPHEN 650 MG RE SUPP: 650.0000 mg | Freq: Four times a day (QID) | RECTAL | Status: DC | PRN

## 2018-06-21 MED ORDER — MORPHINE 100MG IN NS 100ML (1MG/ML) PREMIX INFUSION
0.0000 mg/h | INTRAVENOUS | Status: DC
  Administered 2018-06-21: 5 mg/h via INTRAVENOUS
  Filled 2018-06-21: qty 100

## 2018-06-21 MED ORDER — GLYCOPYRROLATE 0.2 MG/ML IJ SOLN: 0.2000 mg | INTRAMUSCULAR | Status: DC | PRN

## 2018-06-21 MED ORDER — POTASSIUM CHLORIDE 20 MEQ PO PACK
40.0000 meq | PACK | ORAL | Status: DC
  Administered 2018-06-21: 40 meq via ORAL
  Filled 2018-06-21: qty 2

## 2018-06-21 MED ORDER — MORPHINE BOLUS VIA INFUSION
5.0000 mg | INTRAVENOUS | Status: DC | PRN
  Filled 2018-06-21: qty 5

## 2018-06-21 MED ORDER — POLYVINYL ALCOHOL 1.4 % OP SOLN
1.0000 [drp] | Freq: Four times a day (QID) | OPHTHALMIC | Status: DC | PRN
  Filled 2018-06-21: qty 15

## 2018-06-21 MED ORDER — POTASSIUM CHLORIDE CRYS ER 20 MEQ PO TBCR
40.0000 meq | EXTENDED_RELEASE_TABLET | ORAL | Status: DC
  Filled 2018-06-21: qty 2

## 2018-06-21 MED ORDER — ACETAMINOPHEN 325 MG PO TABS: 650.0000 mg | ORAL_TABLET | Freq: Four times a day (QID) | ORAL | Status: DC | PRN

## 2018-06-21 MED ORDER — GLYCOPYRROLATE 1 MG PO TABS
1.0000 mg | ORAL_TABLET | ORAL | Status: DC | PRN
  Filled 2018-06-21: qty 1

## 2018-06-22 LAB — CULTURE, BLOOD (ROUTINE X 2)
Culture: NO GROWTH
Culture: NO GROWTH
Special Requests: ADEQUATE
Special Requests: ADEQUATE

## 2018-06-22 NOTE — Progress Notes (Signed)
Wasted: 90cc of morphine   Location: to the waste to be incinerated container in soiled utility.  Witnessed: Water quality scientist

## 2018-06-22 NOTE — Progress Notes (Signed)
vLTM EEG complete

## 2018-06-22 NOTE — Progress Notes (Signed)
Subjective: On weaning propofol polyspikes return at a frequency of 4 to 5 Hz consistent coupled with clinical movements   Exam: Vitals:   07-06-2018 0900 July 06, 2018 1000  BP: 95/63 105/69  Pulse: 73 77  Resp: 15 17  Temp:    SpO2: 100% (!) 78%   Gen: In bed, intubated Resp: ventilated.  Abd: soft, nt  Neuro limited  MS: does not open eyes or follow commands DI:[id number] non-reactive, corneals absent.  Motor: no movements.    Impression: 84 yo M with post-cardiac arrest status epilepticus due to anoxic brain inury. He has been comatose since arrival.  The return of seizure activity with weaning the propofol coupled with his dismal exam I feel is enough to say that he does not have any significant chance of recovery to independent function.  At this point I would recommend comfort only measures to family  Recommendations: 1) continue Keppra 2) could use propofol to continue to suppress movements, but I doubt that this will improve outcome.  This patient is critically ill and at significant risk of neurological worsening, death and care requires constant monitoring of vital signs, hemodynamics,respiratory and cardiac monitoring, neurological assessment, discussion with family, other specialists and medical decision making of high complexity. I spent 35 minutes of neurocritical care time  in the care of  this patient.  Roland Rack, MD [facility] [phone]  If 7pm- 7am, please page neurology on call as listed in [place]. 06-Jul-2018  11:31 AM

## 2018-06-22 NOTE — Significant Event (Signed)
Family meeting held including patient's 7 adult children and grandchildren.  I addressed patient's reason for admission and current medical management.  We discussed the work-up of his comatose state including ruling out metabolic and infectious causes.  We discussed that his mental status is likely related to anoxic brain injury related to his cardiac arrest complicated by status epilepticus.  After discussion of goals of care, patient was transition to comfort care.  Change CODE STATUS to DNR/comfort care.

## 2018-06-22 NOTE — Progress Notes (Signed)
LTM EEG Maint complete. No skin breakdown Continue to monitor 

## 2018-06-22 NOTE — Progress Notes (Signed)
NAME:  BENNO BRENSINGER, MRN:  [id number], DOB:  05-Jul-1934, LOS: 4 ADMISSION DATE:  05/29/2018, CONSULTATION DATE:  06/19/2018 REFERRING MD:  Ralene Bathe - ED, CHIEF COMPLAINT:  Post-arrest  Brief History   84 yo male with PMH significant for dementia, TIA, Lung cancer (s/p lobectomy), severe emphysema on CXR presenting to [name] ED 06/04/2018 post-arrest. The patient was last seen normal at 1400 and was found at 1428 to be unresponsive. Patient was unresponsive for unknown period of time. EMS was dispatched. CPR was performed for 30 minutes, initial rhythm asystole, then wide complex PEA. Patient received 5x epi as well as bicarb. Of note, food matter was found by EMS in patient's trachea. Patient was intubated in field after retrieval of food matter.  In ED remained unresponsive despite no administered sedation. Patient is HDS. Patient is hypothermic 31*C despite no cooling measures.   Past Medical History  TIA Dementia (KPS scale 90 in May 2018 with rad onc) Lung cancer - calls to patient have failed HTN HLD syncope Closed hip fracture, left  - may 2019 with DR Ninfa Linden - did nto followup to remove sutures Malnutrition Absence of smell Chronic pain (low back, knee) Low body weight Emphysema on CXR and CT Difficulty chewing due to denture issue and hoarse voice  Significant Hospital Events   12/27> admitted post-arrest  12/28 - remains on vent. Myoclonus v seizure reported. Rx Keppra and versed with improvement. Marland Kitchen AKI - with creat 1.65. From hypothermia at admit became febrile overnight to 101F. Not on pressors. On 40% fio2  Consults:  PCCM  Procedures:  12/27> intubation (in field by EMS)  Significant Diagnostic Tests:  12/27 ECG> sinus rhythm with non-specific T wave abnormality  12/27 CXR> ETT visualized at thoracic inlet 12/27 CT head>>>   Micro Data:  12/27 Urine Cx >>> 12/27 Blood Cx >>> 12/27 MRSA pcr - neg  Antimicrobials:   unasyn 12/27>>    Interim  history/subjective:   12/29 - Per RN - one daughter is a Software engineer in [facility] and reported that family (all 8)  will be here 10-Jul-2018 and decision needs to be taken in unison. Currently, despite diprivan rhythmic eye lid and mouth movements continue, no gag (had gag yesterday), no breathing over vent event at vent RR of 6, and no corneals. FEver improved  12/30 Family has requested for family meeting for 12/31 at 11:30 AM. Patient remains unresponsive on vent.  Objective   Blood pressure 94/65, pulse 73, temperature 99.9 F (37.7 C), temperature source Bladder, resp. rate 14, height 5\' 8"  (1.727 m), weight 47.2 kg, SpO2 100 %.    Vent Mode: PRVC FiO2 (%):  [40 %] 40 % Set Rate:  [15 bmp] 15 bmp Vt Set:  [550 mL] 550 mL PEEP:  [5 cmH20] 5 cmH20 Plateau Pressure:  [17 cmH20-20 cmH20] 19 cmH20   Intake/Output Summary (Last 24 hours) at 07-10-18 0920 Last data filed at 07/10/18 0800 Gross per 24 hour  Intake 5557.57 ml  Output 2600 ml  Net 2957.57 ml   Filed Weights   06/19/18 0500 06/20/18 0452 07-10-2018 0200  Weight: 40 kg 42.7 kg 47.2 kg   Physical Exam: General: Unresponsive, laying in bed HENT: Bandaged with EEG leads, ETT in place Respiratory: Mild anterior rhonchi, no wheezing Cardiovascular: RRR, -M/G/R GI: BS+, soft, NTTP  Extremities:No edema or tenderness Neuro: Unresponsive, no withdrawal to pain, no spontaneous movement Skin: Intact, no rashes or bruising GU: Foley in place  LABS   Reviewed  labs myself  PULMONARY Recent Labs  Lab 05/23/2018 1544 06/10/2018 1555 06/18/18 0340  PHART 7.405  --  7.560*  PCO2ART 34.2  --  27.1*  PO2ART 404.0*  --  183*  HCO3 21.4  --  24.3  TCO2 22 21*  --   O2SAT 100.0  --  99.2    CBC Recent Labs  Lab 06/18/18 0448 06/20/18 0349 2018-07-08 0343  HGB 9.6* 8.0* 8.0*  HCT 31.5* 24.7* 26.7*  WBC 7.1 4.6 4.3  PLT 255 222 206    COAGULATION Recent Labs  Lab 06/02/2018 1849  INR 1.18    CARDIAC   Recent Labs  Lab  05/26/2018 1849 06/18/18 0040 06/18/18 0448  TROPONINI 0.08* 0.25* 0.33*   No results for input(s): PROBNP in the last 168 hours.   CHEMISTRY Recent Labs  Lab 06/13/2018 1555 06/03/2018 1849 06/18/18 0448 06/19/18 0326 06/19/18 1240 06/20/18 0349 07/08/18 0343  NA 151* 150* 151*  --  149* 146*  --   K 3.1* 4.0 3.8  --  3.1* 2.6*  --   CL 117* 115* 114*  --  115* 114*  --   CO2  --  23 23  --  25 22  --   GLUCOSE 110* 164* 130*  --  166* 124*  --   BUN 36* 42* 41*  --  28* 24*  --   CREATININE 1.10 1.38* 1.65*  --  1.27* 1.43*  --   CALCIUM  --  8.8* 8.3*  --  8.2* 7.5*  --   MG  --   --  2.2 2.1  --  1.9 1.9  PHOS  --   --  3.0 3.1  --  2.0* 2.4*   Estimated Creatinine Clearance: 27 mL/min (A) (by C-G formula based on SCr of 1.43 mg/dL (H)).   LIVER Recent Labs  Lab 06/19/2018 1849  AST 76*  ALT 50*  ALKPHOS 64  BILITOT 1.1  PROT 6.7  ALBUMIN 3.0*  INR 1.18     INFECTIOUS Recent Labs  Lab 06/11/2018 1556 06/20/2018 1849 06/18/18 0040  LATICACIDVEN 7.24* 3.1* 1.9     ENDOCRINE CBG (last 3)  Recent Labs    06/20/18 2356 07/08/18 0342 07-08-2018 0836  GLUCAP 115* 110* 84         IMAGING x48h  - image(s) personally visualized by myself Dg Chest Port 1 View  Result Date: 06/20/2018 CLINICAL DATA:  Intubation. EXAM: PORTABLE CHEST 1 VIEW COMPARISON:  06/19/2018.  CT 11/05/2017 FINDINGS: Endotracheal tube tip again terminates in the distal trachea, proximal repositioning of approximately 2 cm should be considered. NG tube in stable position. Heart size normal. Postsurgical changes right lung again noted. Mild atelectasis left lung base. Previously identified left lung nodule best identified by prior CT. Small right pleural effusion versus pleural scarring again noted. No pneumothorax. IMPRESSION: 1. Endotracheal tube again terminates in the distal trachea, proximally position proximally 2 cm should be considered. NG tube stable position. 2. Postsurgical changes  right lung again noted. Mild atelectasis left lung base. Previously identified left lung nodule best identified by prior CT. 3. Small right pleural effusion versus pleural scarring again noted. Electronically Signed   By: Marcello Moores  Register   On: 06/20/2018 06:36     Resolved Hospital Problem list     Assessment & Plan:   Cardiac Arrest complicated by status epilepticus in setting of suspected anoxic brain injury Unclear etiology. Some concern for choke as initial event causing respiratory leading to  cardiac arrest (piece of waffle removed from oral cavity by EMS).  Unknown downtime, potentially up to 30 minutes with an additional 30 minutes of ACLS prior to ROSC. -Supportive Care -Management of status epilepticus as noted below  Status Epilepticus secondary to anoxic brain injury -Neurology following. Appreciate the recs -Continue propofol for seizure suppression -Continue Keppra -Address goals of care with family today  Hypotension Likely secondary to sedative medications -Continue phenylephrine to maintain MAPs>65  Acute respiratory failure in setting of cardiac arrest -Continue mechanical ventilation -Not eligible for SBT due to mental status  Hypernatremia / Hyperchloremia. - Continue D5 gtt - Trend BMP  Severe Hypokalemia - Aggressively replete potassium - Repeat BMP this evening  Chronic anemia, stable - CBC daily - Transfuse for Hgb < 7 - Monitor for bleeding  Possible Aspiration pneumonia - Continue Unasyn Day 4  AKI UOP adequate - Serial BMP  Malnutrition - at risk for refeeding syndrome; on TF since 06/18/18 - monitor electrolytes  Hx HTN, HLD. - Continue preadmission ASA, atorvastatin. - Hold preadmission lopressor.  Best practice:  Diet: Start Enteral Nutrition Pain/Anxiety/Delirium protocol (if indicated): Fentanyl PRN / Midazolam PRN.  RASS goal 0 to -1. VAP protocol (if indicated): In place. DVT prophylaxis: SCD's / heparin. GI  prophylaxis: Pantoprazole. Glucose control: N/A. Mobility: Bedrest. Code Status: Full. Family Communication: Plan for family meeting at 11:30 AM today   at admit 06/15/2018\-  Pt has 8 children, 1 being a daughter who is a Software engineer in [place] [employer] [phone].  Leda Gauze will have a teleconference with remainder of siblings regarding goals of care discussions.  Some of siblings who live near by will be at hospital tonight at some point, Leda Gauze will likely not be able to arrive until Monday.  Family wishes are to continue full code for now until they have an opportunity to have a conference call.  Disposition: Remain in ICU  ATTESTATION & SIGNATURE   The patient is critically ill with multiple organ systems failure and requires high complexity decision making for assessment and support, frequent evaluation and titration of therapies, application of advanced monitoring technologies and extensive interpretation of multiple databases.   Critical Care Time devoted to patient care services described in this note is 70 Minutes. This time reflects time of care of this signee Dr. Rodman Pickle. This critical care time does not reflect procedure time, or teaching time or supervisory time of PA/NP/Med student/Med Resident etc but could involve care discussion time.  Rodman Pickle, M.D. [facility] Pulmonary/Critical Care Medicine. Pager: [phone]. After hours pager: [phone].  After hours pager: [phone].

## 2018-06-22 NOTE — Progress Notes (Signed)
Patient extubated per "withdrawal of care" orders per family request.  Patient tolerated well.

## 2018-06-22 NOTE — Procedures (Signed)
LTM-EEG Report  HISTORY: Continuous video-EEG monitoring performed for 84 year old with cardiac arrest, abnormal EEG/seizures. ACQUISITION: International 10-20 system for electrode placement; 18 channels with additional eyes linked to ipsilateral ears and EKG. Additional T1-T2 electrodes were used. Continuous video recording obtained.   EEG NUMBER:  MEDICATIONS:  Day 2: see EMR  DAY #2: from 0730 06/20/18 to 0730 22-Jun-2018  BACKGROUND: An overall low voltage suppressed recording with poor spontaneous variability and absent reactivity. The background consisted of burst-suppression primarily with bursts of 2-5Hz  activity bilaterally every 10-30 seconds.  EPILEPTIFORM/PERIODIC ACTIVITY: There were some superimposed spikes/polyspikes in the bursts, present bilaterally but left maximal. These occurred in isolation and had no ictal evolution. SEIZURES: none EVENTS: none  EKG: no significant arrhythmia  SUMMARY: This was a markedly abnormal continuous video EEG due to background suppression and superimposed diffuse, left maximal, epileptiform discharges. The polyspikes were much improved over the past 24 hours. The burst-suppression pattern remained unchanged. No clear reactivity was observed.

## 2018-06-22 NOTE — Progress Notes (Signed)
I responded to a request from the nurse to provide spiritual support for the patient's family. I visited the patient's room and met his family members. I provided spiritual support by sharing words of encouragement and by leading in prayer. I shared that the Chaplain is available to provide additional support as needed or requested.    06/24/18 1200  Clinical Encounter Type  Visited With Patient and family together  Visit Type Spiritual support  Referral From Nurse  Consult/Referral To Chaplain  Spiritual Encounters  Spiritual Needs Prayer  Stress Factors  Patient Stress Factors None identified    Chaplain Dr Redgie Grayer

## 2018-06-22 DEATH — deceased

## 2018-06-23 LAB — ALPHA-1 ANTITRYPSIN PHENOTYPE: A-1 Antitrypsin, Ser: 162 mg/dL (ref 101–187)

## 2018-06-24 ENCOUNTER — Telehealth: Payer: Self-pay

## 2018-06-24 NOTE — Telephone Encounter (Signed)
On 06/24/2018 I received a dc from [employer]. DC is for donation. Patient is a patient of Doctor Loanne Drilling. DC will be taken to [facility] for signature.

## 2018-06-29 ENCOUNTER — Telehealth: Payer: Self-pay | Admitting: *Deleted

## 2018-06-29 NOTE — Telephone Encounter (Signed)
Received D/C from provider-[employer] were notified for pick up.

## 2018-07-23 NOTE — Death Summary Note (Addendum)
DEATH SUMMARY   Patient Details  Name: Andre Silva MRN: [id number] DOB: 10-26-1934  Admission/Discharge Information   Admit Date:  07-14-18  Date of Death: Date of Death: 07-18-2018  Time of Death: Time of Death: 1402-09-27  Length of Stay: 4  Referring Physician: [facility]   Reason(s) for Hospitalization  Status post arrest  Diagnoses  Preliminary cause of death:  Secondary Diagnoses (including complications and co-morbidities):  Active Problems:   Cardiac arrest [facility]) status epilepticus  anoxic brain injury related to cardiac arrest acute hypoxemic respiratory failure electrolyte abnormalities renal failure.   Brief Hospital Course (including significant findings, care, treatment, and services provided and events leading to death)  Andre Silva is a 84 y.o. year old male  with dementia, TIA, severe emphysema and lung cancer s/p lobectomy who present to [facility] with post-cardiac arrest. He was found down and unresponsive for unknown period of time. CPR was performed x 30 minutes by EMS with initial rhythm asystole followed by PEA. Food was noted to be in patient's trachea by EMS. Hospital course complicated by status epilepticus secondary to anoxic brain injury related to cardiac arrest, acute hypoxemic respiratory failure, electrolyte abnormalities and renal failure. Family meeting held discussing patient's medical condition and poor prognosis of neurological recovery. Decision was made to transition patient to comfort care. Patient expired at 1404.  Cause of death: Anoxic brain injury related to cardiac arrest related to aspiration event   Pertinent Labs and Studies  Significant Diagnostic Studies Ct Head Wo Contrast  Result Date: 07/14/18 CLINICAL DATA:  Pt here post arrest , asystole first rhythm CPR started 1428, cap 30 went into pea, received 5 epi , pulled waffle out of throat and intubated. EXAM: CT HEAD WITHOUT CONTRAST TECHNIQUE: Contiguous  axial images were obtained from the base of the skull through the vertex without intravenous contrast. COMPARISON:  04/25/2018 FINDINGS: Brain: There is central and cortical atrophy. Periventricular white matter changes are consistent with small vessel disease. There is no intra or extra-axial fluid collection or mass lesion. The basilar cisterns and ventricles have a normal appearance. There is no CT evidence for acute infarction or hemorrhage. Vascular: There is atherosclerotic calcification of the internal carotid arteries. No hyperdense vessels. Skull: Normal. Negative for fracture or focal lesion. Sinuses/Orbits: There is minimal mucosal thickening of the paranasal sinuses. No air-fluid levels. Mastoid air cells are normally aerated. Other: None IMPRESSION: 1. Atrophy and small vessel disease. 2.  No evidence for acute intracranial abnormality. 3. Minimal sinus disease. Electronically Signed   By: Nolon Nations M.D.   On: 07-14-2018 17:40   Dg Chest Port 1 View  Result Date: 06/20/2018 CLINICAL DATA:  Intubation. EXAM: PORTABLE CHEST 1 VIEW COMPARISON:  06/19/2018.  CT 11/05/2017 FINDINGS: Endotracheal tube tip again terminates in the distal trachea, proximal repositioning of approximately 2 cm should be considered. NG tube in stable position. Heart size normal. Postsurgical changes right lung again noted. Mild atelectasis left lung base. Previously identified left lung nodule best identified by prior CT. Small right pleural effusion versus pleural scarring again noted. No pneumothorax. IMPRESSION: 1. Endotracheal tube again terminates in the distal trachea, proximally position proximally 2 cm should be considered. NG tube stable position. 2. Postsurgical changes right lung again noted. Mild atelectasis left lung base. Previously identified left lung nodule best identified by prior CT. 3. Small right pleural effusion versus pleural scarring again noted. Electronically Signed   By: Marcello Moores  Register   On:  06/20/2018 06:36  Dg Chest Port 1 View  Result Date: 06/19/2018 CLINICAL DATA:  ET tube. EXAM: PORTABLE CHEST 1 VIEW COMPARISON:  Chest radiograph 06/18/2018 FINDINGS: ET tube terminates in the distal trachea. Enteric tube courses inferior to the diaphragm. Monitoring leads overlie the patient. Pacer pads overlie the patient. Stable cardiac and mediastinal contours. Pulmonary hyperinflation. Postsurgical changes right hilum. No pleural effusion or pneumothorax. Suspicious left lower lobe pulmonary nodule demonstrated on prior chest CT. IMPRESSION: Support apparatus as above. Suspicious left lower lobe pulmonary nodule demonstrated best on prior chest CT. Electronically Signed   By: Lovey Newcomer M.D.   On: 06/19/2018 08:41   Dg Chest Port 1 View  Result Date: 06/18/2018 CLINICAL DATA:  Respiratory failure EXAM: PORTABLE CHEST 1 VIEW COMPARISON:  Yesterday FINDINGS: Advanced endotracheal tube with tip 2.7 cm above the carina. An orogastric tube is coiled over the stomach. COPD with emphysematous change. Normal heart size. Postoperative right hilum. No airspace disease, edema, effusion, or pneumothorax. Left lower lobe pulmonary nodule on most recent chest CT 11/05/2017 IMPRESSION: 1. Advanced endotracheal tube with tip 2.7 cm above the carina. 2. Emphysema. 3. Suspicious left lower lobe pulmonary nodule on chest CT 11/05/2017. Electronically Signed   By: Monte Fantasia M.D.   On: 06/18/2018 08:16   Dg Chest Port 1 View  Result Date: 05/25/2018 CLINICAL DATA:  Patient presented with cardiac arrest. Personal history of RIGHT UPPER lobectomy for lung cancer in 2016. EXAM: PORTABLE CHEST 1 VIEW COMPARISON:  11/08/2017 and earlier, including CT chest 11/05/2017. FINDINGS: External pacing pads. Nasogastric tube courses below the diaphragm into the stomach. Nasogastric tube tip is at the thoracic inlet. Cardiac silhouette normal in size for AP portable technique, unchanged. Thoracic aorta mildly  atherosclerotic, unchanged. Hilar and mediastinal contours otherwise unremarkable. Severe emphysematous changes throughout both lungs. Postsurgical pleuroparenchymal scar at the RIGHT base. Lungs otherwise clear. The spiculated nodule in the MEDIAL LEFT LOWER LOBE identified on the prior CT is inconspicuous on the current chest x-ray. IMPRESSION: 1. Endotracheal tube tip at the thoracic inlet. It could be advanced 5 cm or so if needed. 2. Remaining support apparatus satisfactory. 3. No acute cardiopulmonary disease. Previously identified MEDIAL LEFT LOWER LOBE lung nodule inconspicuous on the portable chest x-ray, likely obscured by the external pacing pads. 4.  Emphysema. ([id number]) Electronically Signed   By: Evangeline Dakin M.D.   On: 06/03/2018 16:21    Microbiology No results found for this or any previous visit (from the past 240 hour(s)).  Lab Basic Metabolic Panel: No results for input(s): NA, K, CL, CO2, GLUCOSE, BUN, CREATININE, CALCIUM, MG, PHOS in the last 168 hours. Liver Function Tests: No results for input(s): AST, ALT, ALKPHOS, BILITOT, PROT, ALBUMIN in the last 168 hours. No results for input(s): LIPASE, AMYLASE in the last 168 hours. No results for input(s): AMMONIA in the last 168 hours. CBC: No results for input(s): WBC, NEUTROABS, HGB, HCT, MCV, PLT in the last 168 hours. Cardiac Enzymes: No results for input(s): CKTOTAL, CKMB, CKMBINDEX, TROPONINI in the last 168 hours. Sepsis Labs: No results for input(s): PROCALCITON, WBC, LATICACIDVEN in the last 168 hours.  Procedures/Operations  EEG 06/19/18  Chi Rodman Pickle 06/30/2018, 12:34 PM

## 2018-07-23 NOTE — Progress Notes (Signed)
Patient went asystole after made comfort care at 1404.  Chest auscultated for 5 minutes. No breath sounds heard. No heart sounds heard.   Second RN, Belenda Cruise, RN, listened for 5 minutes. No breath sounds heard. No heart sounds heard.   Patient declared deceased at 14. MD Loanne Drilling notified of time of death.

## 2019-06-17 IMAGING — DX DG CHEST 1V PORT
1 series · 1 of 1 positions shown · non-contrast
Comparison: 06/19/2018.  CT 11/05/2017

CLINICAL DATA: Intubation.

EXAM:
PORTABLE CHEST 1 VIEW

[chest]
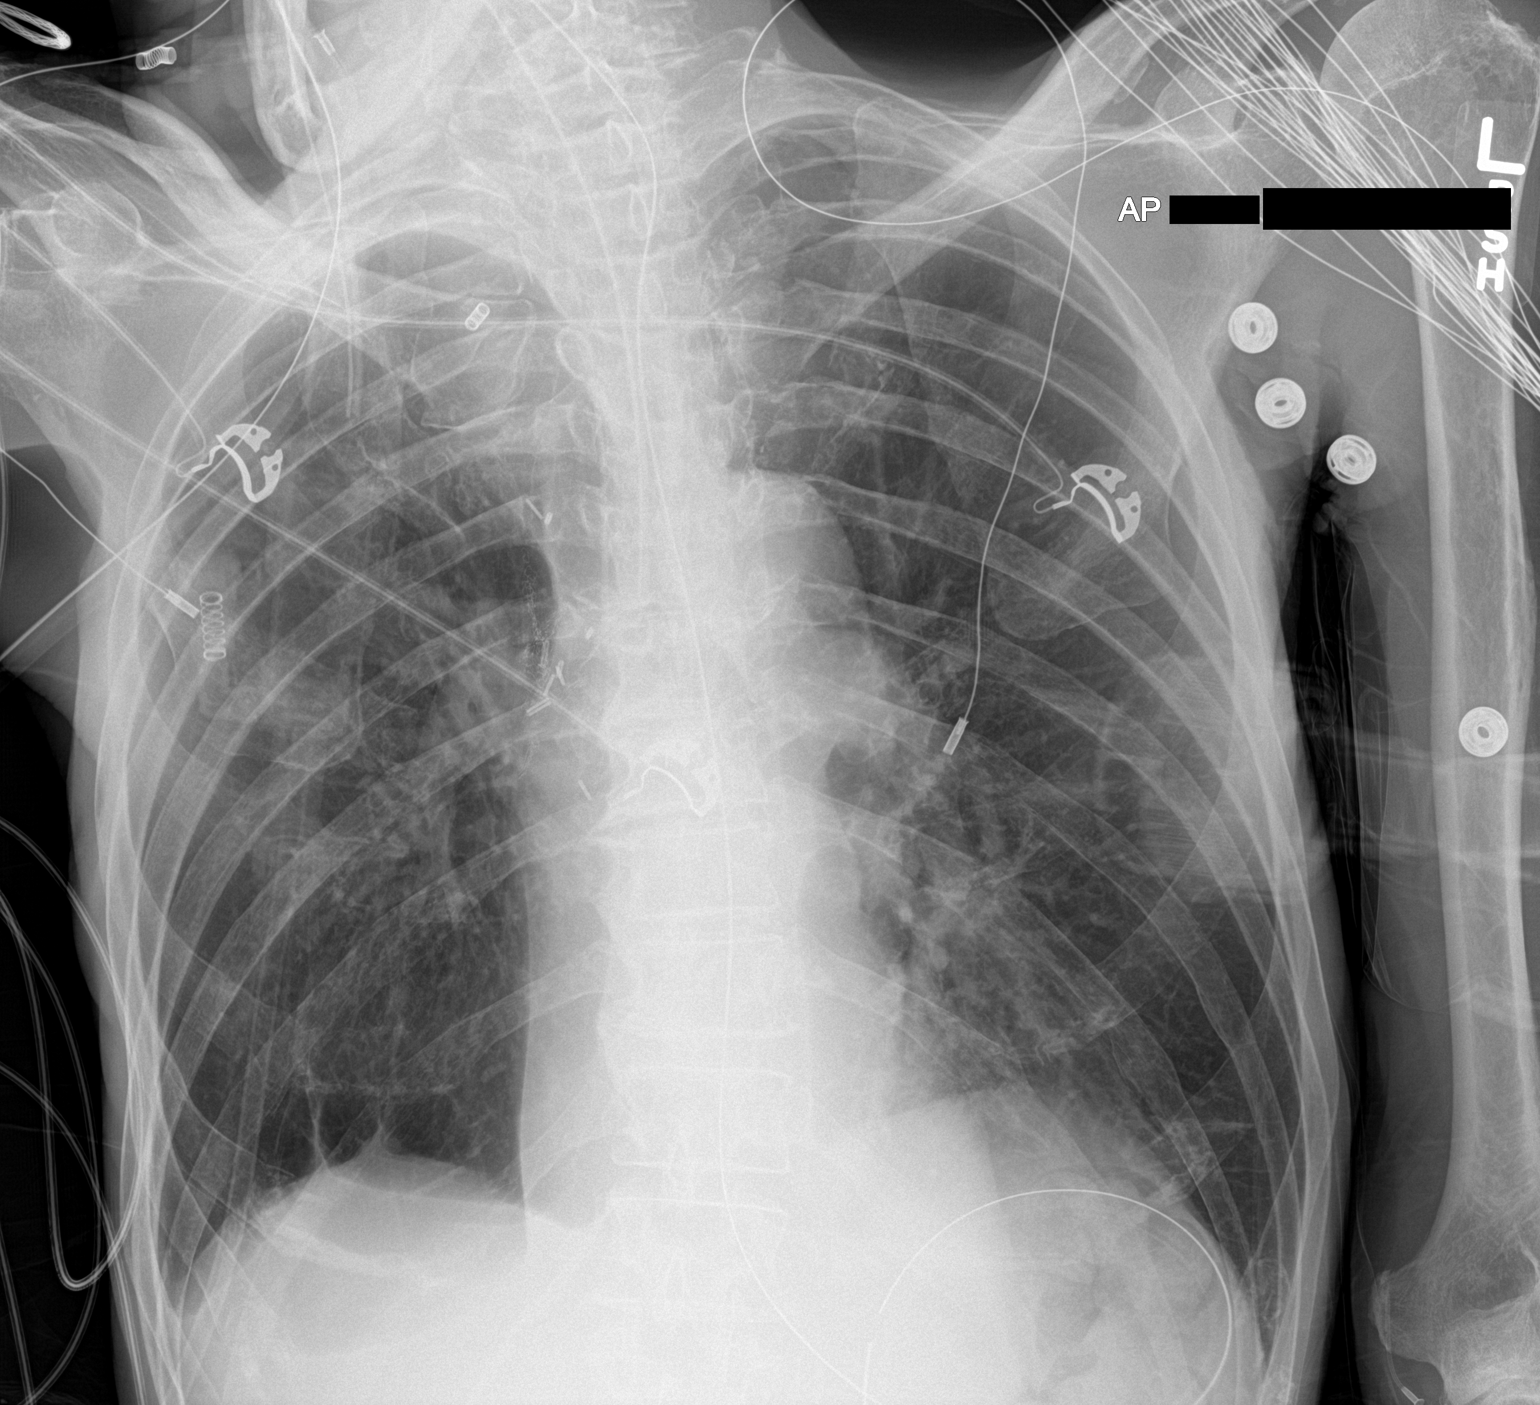

[1 of 1 positions shown; findings below may reference images not displayed]

FINDINGS: Endotracheal tube tip again terminates in the distal trachea,
proximal repositioning of approximately 2 cm should be considered.
NG tube in stable position. Heart size normal. Postsurgical changes
right lung again noted. Mild atelectasis left lung base. Previously
identified left lung nodule best identified by prior CT. Small right
pleural effusion versus pleural scarring again noted. No
pneumothorax.
IMPRESSION: 1. Endotracheal tube again terminates in the distal trachea,
proximally position proximally 2 cm should be considered. NG tube
stable position.

2. Postsurgical changes right lung again noted. Mild atelectasis
left lung base. Previously identified left lung nodule best
identified by prior CT.

3. Small right pleural effusion versus pleural scarring again noted.
